# Patient Record
Sex: Female | Born: 1980 | Race: White | Hispanic: No | Marital: Married | Smoking: Never smoker
Health system: Southern US, Community
[De-identification: ages and names within clinical notes are randomized; demographics above are authoritative.]

## PROBLEM LIST (undated history)

## (undated) ENCOUNTER — Inpatient Hospital Stay (HOSPITAL_COMMUNITY): Payer: Self-pay

## (undated) DIAGNOSIS — E7212 Methylenetetrahydrofolate reductase deficiency: Secondary | ICD-10-CM

## (undated) DIAGNOSIS — Z1589 Genetic susceptibility to other disease: Secondary | ICD-10-CM

## (undated) DIAGNOSIS — R51 Headache: Secondary | ICD-10-CM

## (undated) DIAGNOSIS — Z789 Other specified health status: Secondary | ICD-10-CM

## (undated) DIAGNOSIS — Z8619 Personal history of other infectious and parasitic diseases: Secondary | ICD-10-CM

## (undated) HISTORY — DX: Personal history of other infectious and parasitic diseases: Z86.19

## (undated) HISTORY — DX: Methylenetetrahydrofolate reductase deficiency: E72.12

## (undated) HISTORY — DX: Genetic susceptibility to other disease: Z15.89

## (undated) HISTORY — DX: Headache: R51

## (undated) HISTORY — PX: NO PAST SURGERIES: SHX2092

---

## 2008-08-14 ENCOUNTER — Ambulatory Visit: Payer: Self-pay | Admitting: Internal Medicine

## 2008-08-14 DIAGNOSIS — R519 Headache, unspecified: Secondary | ICD-10-CM | POA: Insufficient documentation

## 2008-08-14 DIAGNOSIS — R51 Headache: Secondary | ICD-10-CM

## 2008-08-14 LAB — CONVERTED CEMR LAB
Nitrite: NEGATIVE
Specific Gravity, Urine: 1.02
Urobilinogen, UA: NEGATIVE
pH: 5

## 2008-12-18 ENCOUNTER — Emergency Department (HOSPITAL_BASED_OUTPATIENT_CLINIC_OR_DEPARTMENT_OTHER): Admission: EM | Admit: 2008-12-18 | Discharge: 2008-12-18 | Payer: Self-pay | Admitting: Emergency Medicine

## 2009-04-30 ENCOUNTER — Inpatient Hospital Stay (HOSPITAL_COMMUNITY): Admission: AD | Admit: 2009-04-30 | Discharge: 2009-04-30 | Payer: Self-pay | Admitting: Internal Medicine

## 2009-06-02 ENCOUNTER — Inpatient Hospital Stay (HOSPITAL_COMMUNITY): Admission: RE | Admit: 2009-06-02 | Discharge: 2009-06-03 | Payer: Self-pay | Admitting: Obstetrics and Gynecology

## 2009-07-21 ENCOUNTER — Encounter: Admission: RE | Admit: 2009-07-21 | Discharge: 2009-07-21 | Payer: Self-pay | Admitting: Obstetrics and Gynecology

## 2010-01-26 ENCOUNTER — Encounter: Admission: RE | Admit: 2010-01-26 | Discharge: 2010-01-26 | Payer: Self-pay | Admitting: Obstetrics and Gynecology

## 2010-08-30 ENCOUNTER — Other Ambulatory Visit (HOSPITAL_COMMUNITY): Payer: Self-pay | Admitting: Obstetrics and Gynecology

## 2010-08-30 DIAGNOSIS — Z09 Encounter for follow-up examination after completed treatment for conditions other than malignant neoplasm: Secondary | ICD-10-CM

## 2010-09-13 ENCOUNTER — Ambulatory Visit
Admission: RE | Admit: 2010-09-13 | Discharge: 2010-09-13 | Disposition: A | Discharge status: Home / Self Care | Payer: PRIVATE HEALTH INSURANCE | Source: Ambulatory Visit | Attending: Obstetrics and Gynecology | Admitting: Obstetrics and Gynecology

## 2010-09-13 DIAGNOSIS — Z09 Encounter for follow-up examination after completed treatment for conditions other than malignant neoplasm: Secondary | ICD-10-CM

## 2010-09-13 LAB — CBC
HCT: 31 % — ABNORMAL LOW (ref 36.0–46.0)
HCT: 36.6 % (ref 36.0–46.0)
Hemoglobin: 10.7 g/dL — ABNORMAL LOW (ref 12.0–15.0)
Platelets: 150 10*3/uL (ref 150–400)
RBC: 3.36 MIL/uL — ABNORMAL LOW (ref 3.87–5.11)
RBC: 3.95 MIL/uL (ref 3.87–5.11)

## 2010-09-13 LAB — RPR: RPR Ser Ql: NONREACTIVE

## 2011-04-21 ENCOUNTER — Ambulatory Visit (INDEPENDENT_AMBULATORY_CARE_PROVIDER_SITE_OTHER): Payer: PRIVATE HEALTH INSURANCE | Admitting: Internal Medicine

## 2011-04-21 VITALS — BP 100/62 | Temp 98.4°F | Ht 67.0 in | Wt 133.0 lb

## 2011-04-21 DIAGNOSIS — Z23 Encounter for immunization: Secondary | ICD-10-CM

## 2011-04-21 DIAGNOSIS — Z0000 Encounter for general adult medical examination without abnormal findings: Secondary | ICD-10-CM

## 2011-04-21 DIAGNOSIS — H6120 Impacted cerumen, unspecified ear: Secondary | ICD-10-CM

## 2011-04-21 NOTE — Progress Notes (Signed)
  Subjective:    Patient ID: Becky Howard, female    DOB: 01-20-1981, 30 y.o.   MRN: [id number]  HPI 30 year old patient who is seen today complaining of diminished auditory acuity on the right. She has had some difficulties with cerumen impactions in the past. No ear pain fever or drainage  Review of Systems  HENT: Positive for hearing loss. Negative for ear pain.        Objective:   Physical Exam  Constitutional: She appears well-developed and well-nourished. No distress.  HENT:       Bilateral cerumen impactions right greater than left          Assessment & Plan:    Cerumen impaction. Both canals irrigated until clear

## 2011-04-21 NOTE — Patient Instructions (Signed)
Call or return to clinic prn if these symptoms worsen or fail to improve as anticipated.

## 2012-03-01 LAB — OB RESULTS CONSOLE ANTIBODY SCREEN: Antibody Screen: NEGATIVE

## 2012-03-01 LAB — OB RESULTS CONSOLE GC/CHLAMYDIA: Gonorrhea: NEGATIVE

## 2012-03-01 LAB — OB RESULTS CONSOLE ABO/RH: RH Type: POSITIVE

## 2012-03-01 LAB — OB RESULTS CONSOLE RUBELLA ANTIBODY, IGM: Rubella: IMMUNE

## 2012-03-01 LAB — OB RESULTS CONSOLE HEPATITIS B SURFACE ANTIGEN: Hepatitis B Surface Ag: NEGATIVE

## 2012-06-13 NOTE — L&D Delivery Note (Signed)
Delivery Note At 5:50 PM a viable and healthy female was delivered via Vaginal, Spontaneous Delivery (Presentation: ROA ).  APGAR: 8, 9; weight pending .   Placenta status: spontaneous, intact.  Cord:  with the following complications: Tight [place] x 2 , not reducible, cut on perineum .  Cord pH: na  Anesthesia: Epidural  Episiotomy: none Lacerations: second degree Suture Repair: 3.0 vicryl rapide Est. Blood Loss (mL): 300  Mom to postpartum.  Baby to nursery-stable.  Teigan Sahli J 10/01/2012, 6:04 PM

## 2012-08-30 ENCOUNTER — Inpatient Hospital Stay (HOSPITAL_COMMUNITY): Admission: AD | Admit: 2012-08-30 | Payer: Self-pay | Source: Ambulatory Visit | Admitting: Obstetrics and Gynecology

## 2012-09-03 LAB — OB RESULTS CONSOLE GBS: GBS: NEGATIVE

## 2012-09-28 ENCOUNTER — Encounter (HOSPITAL_COMMUNITY): Payer: Self-pay

## 2012-09-28 ENCOUNTER — Other Ambulatory Visit: Payer: Self-pay | Admitting: Obstetrics and Gynecology

## 2012-09-28 ENCOUNTER — Telehealth (HOSPITAL_COMMUNITY): Payer: Self-pay | Admitting: *Deleted

## 2012-09-28 ENCOUNTER — Observation Stay (HOSPITAL_COMMUNITY)
Admission: RE | Admit: 2012-09-28 | Discharge: 2012-09-28 | Disposition: A | Discharge status: Home / Self Care | Payer: PRIVATE HEALTH INSURANCE | Source: Ambulatory Visit | Attending: Obstetrics and Gynecology | Admitting: Obstetrics and Gynecology

## 2012-09-28 ENCOUNTER — Encounter (HOSPITAL_COMMUNITY): Payer: Self-pay | Admitting: *Deleted

## 2012-09-28 DIAGNOSIS — O321XX0 Maternal care for breech presentation, not applicable or unspecified: Secondary | ICD-10-CM | POA: Diagnosis present

## 2012-09-28 DIAGNOSIS — IMO0001 Reserved for inherently not codable concepts without codable children: Secondary | ICD-10-CM | POA: Diagnosis not present

## 2012-09-28 HISTORY — DX: Other specified health status: Z78.9

## 2012-09-28 MED ORDER — TERBUTALINE SULFATE 1 MG/ML IJ SOLN
0.2500 mg | Freq: Once | INTRAMUSCULAR | Status: AC
  Administered 2012-09-28: 0.25 mg via SUBCUTANEOUS
  Filled 2012-09-28: qty 1

## 2012-09-28 MED ORDER — LACTATED RINGERS IV SOLN
INTRAVENOUS | Status: DC
  Administered 2012-09-28: 08:00:00 via INTRAVENOUS

## 2012-09-28 NOTE — Telephone Encounter (Signed)
Preadmission screen  

## 2012-09-28 NOTE — Progress Notes (Signed)
Becky Howard is a 32 y.o. [id number] at [redacted]w[redacted]d by LMP admitted for ECV  Subjective: Comfortable  Objective: BP 125/71  Pulse 67  Temp(Src) 98.2 F (36.8 C) (Oral)  Ht 5\' 6"  (1.676 m)  Wt 77.111 kg (170 lb)  BMI 27.45 kg/m2  SpO2 100%      FHT:  FHR: 125 bpm, variability: moderate,  accelerations:  Present,  decelerations:  Absent UC:   None - UI noted SVE:    closed /50/out of pelvis  Labs: None ordered  Assessment / Plan: 38 weeks Frank breech for ECV- consent signed.  Labor: na Preeclampsia:  na Fetal Wellbeing:  Category I Pain Control:  Labor support without medications I/D:  n/a Anticipated MOD:  pending ECV  Rikki Smestad J 09/28/2012, 8:55 AM ECV note dictated

## 2012-09-28 NOTE — Progress Notes (Signed)
09/28/2012 @ 0820  S:Here for ECV - breech presentation     understands procedure - plan of care     active FM / occasional ctx / no bleeding   O:  VS: Blood pressure 125/71, pulse 66, temperature 98.2 F (36.8 C), temperature source Oral, height 5\' 6"  (1.676 m), weight 77.111 kg (170 lb).        FHR : baseline 125 / variability moderate / accels + / decels none        Toco: occasional - terbutaline 0.25mg  sub-q pre-procedure protocol        Cervix : deferred        Bedside sono : frank breech with head RUQ - subjectively normal AFI  A: breech presentation for ECV attempt     FHR category 1  P: Dr Billy [place] updated with sono results       prepare forECV attempt      consent signed     Marlinda Mike CNM, MSN 09/28/2012, [phone]

## 2012-09-29 NOTE — H&P (Signed)
NAMEMarland Kitchen  Becky Howard, Becky Howard NO.:  [id number]  MEDICAL RECORD NO.:  [id number]  LOCATION:  9172                          F[facility]ILITY:  [facility]  PHYSICIAN:  Lenoard Aden, M.D.DATE OF BIRTH:  30-Jan-1981  DATE OF ADMISSION:  09/28/2012 DATE OF DISCHARGE:  09/28/2012                             HISTORY & PHYSICAL   INDICATION:  [name] breech for external cephalic version.  She is a 32- year-old white female, G3, P2, at 8 and 4/7th weeks with frank breech presenting fetus for external cephalic version.  She has no known drug allergies.  MEDICATIONS:  Prenatal vitamins.  SOCIAL HISTORY:  She is a nonsmoker, nondrinker.  She denies domestic or physical violence.  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  FAMILY HISTORY:  Lymphoma, hypertension, thyroid cancer, and polycystic kidney disease.  OBSTETRIC HISTORY:  She has an obstetric history remarkable for 3 spontaneous losses and 2 in term deliveries.  Prenatal course otherwise uncomplicated.  PHYSICAL EXAMINATION:  GENERAL:  She is a well-developed, well- nourished, white female, in no acute distress. HEENT:  Normal. NECK:  Supple.  Full range of motion. LUNGS:  Clear. HEART:  Regular rhythm. ABDOMEN:  Soft gravid, nontender.  Estimated fetal weight 8.5 pounds. Cervix is closed, 50% breech and out of the pelvis. EXTREMITIES:  No cords. NEUROLOGIC:  Nonfocal. SKIN:  Intact.  IMPRESSION: 1. Term intrauterine pregnancy with frank breech presentation. 2. Good candidate for external cephalic version.  PLAN:  Proceed with external cephalic version.  Consent signed.  Small risk of fetal bradycardia with need for emergent C-section at approximately 5% rate is noted.  Incidence of small instance of placental abruption with fetal manipulation was discussed.  Failure rate of approximately 50% noted.  The need for post ECP monitoring noted, terbutaline given.  The patient acknowledges  and wishes to proceed.     Lenoard Aden, M.D.     RJT/MEDQ  D:  09/28/2012  T:  09/28/2012  Job:  [id number]

## 2012-09-29 NOTE — Op Note (Signed)
NAME:  Becky Howard, Becky Howard NO.:  [id number]  MEDICAL RECORD NO.:  [id number]  LOCATION:  9172                          [facility]ILITY[facility]  PHYSICIAN:  Lenoard Aden, M.D.DATE OF BIRTH:  1981-01-13  DATE OF PROCEDURE: DATE OF DISCHARGE:  09/28/2012                              OPERATIVE REPORT   DESCRIPTION OF PROCEDURE:  After being apprised of small risks of need for emergent C-section due to fetal bradycardia with need for emergent delivery, the patient's consents were signed after reactive NST. Ultrasound confirms a frank breech presentation supine to the maternal left.  Under ultrasound guidance, a forward roll was attempted after elevating the fetal buttocks out of the pelvis without success at this time then heart rate was assured to be normal and a backward roll was attempted in a standard fashion.  Upon manipulation, the version is successful.  Fetal heart tones postprocedure in the 50-60 beat per minute range x1-2 minute with slow elevation into the 120-130 beat per minute range, and now noted to be reactive without evidence of uterine contractions.  The patient tolerated the procedure well, is recovering. We will proceed with post ECV monitoring.  Discharge home once NST is reactive.  Standard labor precautions.  Follow up in the office 1 week.     Lenoard Aden, M.D.     RJT/MEDQ  D:  09/28/2012  T:  09/29/2012  Job:  [id number]

## 2012-10-01 ENCOUNTER — Inpatient Hospital Stay (HOSPITAL_COMMUNITY)
Admission: RE | Admit: 2012-10-01 | Discharge: 2012-10-02 | DRG: 775 | Disposition: A | Discharge status: Home / Self Care | Payer: PRIVATE HEALTH INSURANCE | Source: Ambulatory Visit | Attending: Obstetrics and Gynecology | Admitting: Obstetrics and Gynecology

## 2012-10-01 ENCOUNTER — Inpatient Hospital Stay (HOSPITAL_COMMUNITY): Payer: PRIVATE HEALTH INSURANCE | Admitting: Anesthesiology

## 2012-10-01 ENCOUNTER — Encounter (HOSPITAL_COMMUNITY): Payer: Self-pay

## 2012-10-01 ENCOUNTER — Other Ambulatory Visit: Payer: Self-pay | Admitting: Obstetrics and Gynecology

## 2012-10-01 ENCOUNTER — Encounter (HOSPITAL_COMMUNITY): Payer: Self-pay | Admitting: Anesthesiology

## 2012-10-01 VITALS — BP 115/68 | HR 55 | Temp 98.7°F | Resp 18 | Ht 66.0 in | Wt 170.0 lb

## 2012-10-01 DIAGNOSIS — IMO0001 Reserved for inherently not codable concepts without codable children: Secondary | ICD-10-CM

## 2012-10-01 DIAGNOSIS — R51 Headache: Secondary | ICD-10-CM

## 2012-10-01 DIAGNOSIS — IMO0002 Reserved for concepts with insufficient information to code with codable children: Principal | ICD-10-CM | POA: Diagnosis present

## 2012-10-01 DIAGNOSIS — O321XX0 Maternal care for breech presentation, not applicable or unspecified: Secondary | ICD-10-CM

## 2012-10-01 LAB — CBC
Platelets: 146 10*3/uL — ABNORMAL LOW (ref 150–400)
RBC: 3.86 MIL/uL — ABNORMAL LOW (ref 3.87–5.11)
WBC: 10.5 10*3/uL (ref 4.0–10.5)

## 2012-10-01 LAB — RPR: RPR Ser Ql: NONREACTIVE

## 2012-10-01 LAB — ABO/RH: ABO/RH(D): O POS

## 2012-10-01 MED ORDER — ONDANSETRON HCL 4 MG/2ML IJ SOLN: 4.0000 mg | INTRAMUSCULAR | Status: DC | PRN

## 2012-10-01 MED ORDER — OXYTOCIN BOLUS FROM INFUSION: 500.0000 mL | INTRAVENOUS | Status: DC

## 2012-10-01 MED ORDER — ZOLPIDEM TARTRATE 5 MG PO TABS: 5.0000 mg | ORAL_TABLET | Freq: Every evening | ORAL | Status: DC | PRN

## 2012-10-01 MED ORDER — DIPHENHYDRAMINE HCL 50 MG/ML IJ SOLN: 12.5000 mg | INTRAMUSCULAR | Status: DC | PRN

## 2012-10-01 MED ORDER — TERBUTALINE SULFATE 1 MG/ML IJ SOLN: 0.2500 mg | Freq: Once | INTRAMUSCULAR | Status: DC | PRN

## 2012-10-01 MED ORDER — FLEET ENEMA 7-19 GM/118ML RE ENEM: 1.0000 | ENEMA | RECTAL | Status: DC | PRN

## 2012-10-01 MED ORDER — EPHEDRINE 5 MG/ML INJ
10.0000 mg | INTRAVENOUS | Status: DC | PRN
  Administered 2012-10-01: 10 mg via INTRAVENOUS

## 2012-10-01 MED ORDER — TETANUS-DIPHTH-ACELL PERTUSSIS 5-2.5-18.5 LF-MCG/0.5 IM SUSP
0.5000 mL | Freq: Once | INTRAMUSCULAR | Status: AC
  Administered 2012-10-02: 0.5 mL via INTRAMUSCULAR
  Filled 2012-10-01 (×2): qty 0.5

## 2012-10-01 MED ORDER — PHENYLEPHRINE 40 MCG/ML (10ML) SYRINGE FOR IV PUSH (FOR BLOOD PRESSURE SUPPORT)
80.0000 ug | PREFILLED_SYRINGE | INTRAVENOUS | Status: DC | PRN
  Filled 2012-10-01: qty 5

## 2012-10-01 MED ORDER — METHYLERGONOVINE MALEATE 0.2 MG PO TABS: 0.2000 mg | ORAL_TABLET | ORAL | Status: DC | PRN

## 2012-10-01 MED ORDER — LANOLIN HYDROUS EX OINT: TOPICAL_OINTMENT | CUTANEOUS | Status: DC | PRN

## 2012-10-01 MED ORDER — BUTORPHANOL TARTRATE 1 MG/ML IJ SOLN: 1.0000 mg | INTRAMUSCULAR | Status: DC | PRN

## 2012-10-01 MED ORDER — WITCH HAZEL-GLYCERIN EX PADS: 1.0000 "application " | MEDICATED_PAD | CUTANEOUS | Status: DC | PRN

## 2012-10-01 MED ORDER — CITRIC ACID-SODIUM CITRATE 334-500 MG/5ML PO SOLN: 30.0000 mL | ORAL | Status: DC | PRN

## 2012-10-01 MED ORDER — FENTANYL 2.5 MCG/ML BUPIVACAINE 1/10 % EPIDURAL INFUSION (WH - ANES)
14.0000 mL/h | INTRAMUSCULAR | Status: DC | PRN
  Administered 2012-10-01: 14 mL/h via EPIDURAL
  Filled 2012-10-01: qty 125

## 2012-10-01 MED ORDER — LACTATED RINGERS IV SOLN
INTRAVENOUS | Status: DC
  Administered 2012-10-01 (×2): via INTRAVENOUS

## 2012-10-01 MED ORDER — IBUPROFEN 600 MG PO TABS: 600.0000 mg | ORAL_TABLET | Freq: Four times a day (QID) | ORAL | Status: DC | PRN

## 2012-10-01 MED ORDER — ONDANSETRON HCL 4 MG PO TABS: 4.0000 mg | ORAL_TABLET | ORAL | Status: DC | PRN

## 2012-10-01 MED ORDER — METHYLERGONOVINE MALEATE 0.2 MG/ML IJ SOLN: 0.2000 mg | INTRAMUSCULAR | Status: DC | PRN

## 2012-10-01 MED ORDER — SODIUM BICARBONATE 8.4 % IV SOLN
INTRAVENOUS | Status: DC | PRN
  Administered 2012-10-01: 5 mL via EPIDURAL

## 2012-10-01 MED ORDER — DIPHENHYDRAMINE HCL 25 MG PO CAPS: 25.0000 mg | ORAL_CAPSULE | Freq: Four times a day (QID) | ORAL | Status: DC | PRN

## 2012-10-01 MED ORDER — LIDOCAINE HCL (PF) 1 % IJ SOLN: 30.0000 mL | INTRAMUSCULAR | Status: DC | PRN

## 2012-10-01 MED ORDER — SIMETHICONE 80 MG PO CHEW: 80.0000 mg | CHEWABLE_TABLET | ORAL | Status: DC | PRN

## 2012-10-01 MED ORDER — OXYTOCIN 40 UNITS IN LACTATED RINGERS INFUSION - SIMPLE MED: 62.5000 mL/h | INTRAVENOUS | Status: DC

## 2012-10-01 MED ORDER — OXYTOCIN 40 UNITS IN LACTATED RINGERS INFUSION - SIMPLE MED
1.0000 m[IU]/min | INTRAVENOUS | Status: DC
  Administered 2012-10-01: 2 m[IU]/min via INTRAVENOUS
  Filled 2012-10-01: qty 1000

## 2012-10-01 MED ORDER — OXYCODONE-ACETAMINOPHEN 5-325 MG PO TABS: 1.0000 | ORAL_TABLET | ORAL | Status: DC | PRN

## 2012-10-01 MED ORDER — LACTATED RINGERS IV SOLN: 500.0000 mL | Freq: Once | INTRAVENOUS | Status: DC

## 2012-10-01 MED ORDER — SENNOSIDES-DOCUSATE SODIUM 8.6-50 MG PO TABS
2.0000 | ORAL_TABLET | Freq: Every day | ORAL | Status: DC
  Administered 2012-10-01: 2 via ORAL

## 2012-10-01 MED ORDER — ONDANSETRON HCL 4 MG/2ML IJ SOLN: 4.0000 mg | Freq: Four times a day (QID) | INTRAMUSCULAR | Status: DC | PRN

## 2012-10-01 MED ORDER — BENZOCAINE-MENTHOL 20-0.5 % EX AERO
1.0000 "application " | INHALATION_SPRAY | CUTANEOUS | Status: DC | PRN
  Filled 2012-10-01: qty 56

## 2012-10-01 MED ORDER — EPHEDRINE 5 MG/ML INJ
10.0000 mg | INTRAVENOUS | Status: DC | PRN
  Filled 2012-10-01: qty 4

## 2012-10-01 MED ORDER — LACTATED RINGERS IV SOLN: 500.0000 mL | INTRAVENOUS | Status: DC | PRN

## 2012-10-01 MED ORDER — PHENYLEPHRINE 40 MCG/ML (10ML) SYRINGE FOR IV PUSH (FOR BLOOD PRESSURE SUPPORT): 80.0000 ug | PREFILLED_SYRINGE | INTRAVENOUS | Status: DC | PRN

## 2012-10-01 MED ORDER — ACETAMINOPHEN 325 MG PO TABS: 650.0000 mg | ORAL_TABLET | ORAL | Status: DC | PRN

## 2012-10-01 MED ORDER — DIBUCAINE 1 % RE OINT
1.0000 "application " | TOPICAL_OINTMENT | RECTAL | Status: DC | PRN
  Filled 2012-10-01: qty 28

## 2012-10-01 MED ORDER — PRENATAL MULTIVITAMIN CH
1.0000 | ORAL_TABLET | Freq: Every day | ORAL | Status: DC
  Administered 2012-10-02: 1 via ORAL
  Filled 2012-10-01: qty 1

## 2012-10-01 MED ORDER — IBUPROFEN 600 MG PO TABS
600.0000 mg | ORAL_TABLET | Freq: Four times a day (QID) | ORAL | Status: DC
  Administered 2012-10-02 (×3): 600 mg via ORAL
  Filled 2012-10-01 (×3): qty 1

## 2012-10-01 NOTE — Anesthesia Procedure Notes (Signed)
Epidural Patient location during procedure: OB  Preanesthetic Checklist Completed: patient identified, site marked, surgical consent, pre-op evaluation, timeout performed, IV checked, risks and benefits discussed and monitors and equipment checked  Epidural Patient position: sitting Prep: site prepped and draped and DuraPrep Patient monitoring: continuous pulse ox and blood pressure Approach: midline Injection technique: LOR air  Needle:  Needle type: Tuohy  Needle gauge: 17 G Needle length: 9 cm and 9 Needle insertion depth: 4 cm Catheter type: closed end flexible Catheter size: 19 Gauge Catheter at skin depth: 9 cm Test dose: negative  Assessment Events: blood not aspirated, injection not painful, no injection resistance, negative IV test and no paresthesia  Additional Notes Dosing of Epidural:  1st dose, through catheter ............................................. epi 1:200K + Xylocaine 40 mg  2nd dose, through catheter, after waiting 3 minutes.....epi 1:200K + Xylocaine 60 mg    ( 2% Xylo charted as a single dose in Epic Meds for ease of charting; actual dosing was fractionated as above, for saftey's sake)  As each dose occurred, patient was free of IV sx; and patient exhibited no evidence of SA injection.  Patient is more comfortable after epidural dosed. Please see RN's note for documentation of vital signs,and FHR which are stable.  Patient reminded not to try to ambulate with numb legs, and that an RN must be present when she attempts to get up.       

## 2012-10-01 NOTE — H&P (Signed)
NAMEMarland Howard  RYELEIGH, SANTORE NO.:  [id number]  MEDICAL RECORD NO.:  [id number]  LOCATION:  9108                          [facility]ILITY:  [facility]  PHYSICIAN:  Lenoard Aden, M.D.DATE OF BIRTH:  [birth year]  DATE OF ADMISSION:  10/01/2012 DATE OF DISCHARGE:                             HISTORY & PHYSICAL   CHIEF COMPLAINT:  History of breech presentation, status post successful external cephalic version with favorable cervix for induction.  HISTORY OF PRESENT ILLNESS:  She is a 32 year old white female, G3, P2 at [redacted] weeks gestation with aforementioned indications for induction.  ALLERGIES:  She has no known drug allergies.  MEDICATIONS:  Prenatal vitamins.  SOCIAL HISTORY:  She is a nonsmoker, nondrinker.  She denies domestic or physical violence.  She has a history of vaginal delivery x2 and an SAB x2.  FAMILY HISTORY:  Lymphoma, hypertension, and thyroid cancer.  PHYSICAL EXAMINATION:  GENERAL:  She is a well-developed, well- nourished, white female, in no acute distress. HEENT:  Normal. NECK:  Supple.  Full range of motion. LUNGS:  Clear. HEART:  Regular rhythm. ABDOMEN:  Soft, gravid, nontender.  Estimated fetal weight of 8 pounds. Cervix is 2-3 cm, 60% vertex, -2. EXTREMITIES:  No cords. NEUROLOGIC:  Nonfocal. SKIN:  Intact.  IMPRESSION:  Term intrauterine pregnancy with a history of breech presentation, status post external cephalic version.  Unfavorable cervix.  Now for induction at 39 weeks.  PLAN:  Proceed with Pitocin induction epidural as needed.  Anticipate attempts at vaginal delivery.     Lenoard Aden, M.D.     RJT/MEDQ  D:  10/01/2012  T:  10/01/2012  Job:  [id number]

## 2012-10-01 NOTE — Progress Notes (Signed)
Becky Howard is a 32 y.o. [id number] at [redacted]w[redacted]d by LMP admitted for induction of labor due to history of breech s/p successful ECV.  Subjective: comfortable  Objective: BP 130/78  Pulse 78  Temp(Src) 98.3 F (36.8 C) (Oral)  Ht 5\' 6"  (1.676 m)  Wt 77.111 kg (170 lb)  BMI 27.45 kg/m2      FHT:  FHR: 155 bpm, variability: moderate,  accelerations:  Present,  decelerations:  Absent UC:   Every 3 min mild SVE:    3/60/-2 AROM-clear  Labs: CBC    Component Value Date/Time   WBC 10.5 10/01/2012 0820   RBC 3.86* 10/01/2012 0820   HGB 12.3 10/01/2012 0820   HCT 35.2* 10/01/2012 0820   PLT 146* 10/01/2012 0820   MCV 91.2 10/01/2012 0820   MCH 31.9 10/01/2012 0820   MCHC 34.9 10/01/2012 0820   RDW 13.7 10/01/2012 0820      Assessment / Plan: Induction of labor due to history of Breech s/p ECV,  progressing well on pitocin  Labor: Progressing normally Preeclampsia:  na Fetal Wellbeing:  Category I Pain Control:  Labor support without medications I/D:  n/a Anticipated MOD:  NSVD  [name] J 10/01/2012, 8:20 AM

## 2012-10-01 NOTE — Anesthesia Preprocedure Evaluation (Signed)

## 2012-10-02 ENCOUNTER — Encounter (HOSPITAL_COMMUNITY): Payer: Self-pay

## 2012-10-02 LAB — CBC
HCT: 33.2 % — ABNORMAL LOW (ref 36.0–46.0)
Hemoglobin: 11.3 g/dL — ABNORMAL LOW (ref 12.0–15.0)
RDW: 13.5 % (ref 11.5–15.5)
WBC: 13.2 10*3/uL — ABNORMAL HIGH (ref 4.0–10.5)

## 2012-10-02 MED ORDER — IBUPROFEN 600 MG PO TABS: 600.0000 mg | ORAL_TABLET | Freq: Four times a day (QID) | ORAL | Status: AC

## 2012-10-02 NOTE — Anesthesia Postprocedure Evaluation (Signed)
Anesthesia Post Note  Patient: Becky Howard  Procedure(s) Performed: * No procedures listed *  Anesthesia type: Epidural  Patient location: Mother/Baby  Post pain: Pain level controlled  Post assessment: Post-op Vital signs reviewed  Last Vitals:  Filed Vitals:   10/02/12 0600  BP: 115/68  Pulse: 55  Temp: 37.1 C  Resp: 18    Post vital signs: Reviewed  Level of consciousness: awake  Complications: No apparent anesthesia complications

## 2012-10-02 NOTE — Progress Notes (Signed)
Patient ID: Becky Howard, female   DOB: 1980-09-08, 32 y.o.   MRN: [id number] PPD # 1  Subjective: Pt reports feeling well and eager for early d/c home/ Pain controlled with ibuprofen Tolerating po/ Voiding without problems/ No n/v Bleeding is moderate Newborn info:  Information for the patient's newborn:  Elaijah, Munoz [[id number]]  female Feeding: breast   Objective:  VS: Blood pressure 115/68, pulse 55, temperature 98.7 F (37.1 C), temperature source Oral, resp. rate 18.    Recent Labs  10/01/12 0820 10/02/12 0615  WBC 10.5 13.2*  HGB 12.3 11.3*  HCT 35.2* 33.2*  PLT 146* 140*    Blood type: --/--/O POS (04/21 0820) Rubella: Immune (09/19 0000)    Physical Exam:  General:  alert, cooperative and no distress CV: Regular rate and rhythm Resp: clear Abdomen: soft, nontender, normal bowel sounds Uterine Fundus: firm, below umbilicus, nontender Perineum: healing with good reapproximation Lochia: moderate Ext: edema trace and Homans sign is negative, no sign of DVT   A/P: PPD # 1/ [id number]/ S/P: SVD w/2nd deg repair Doing well and stable for early d/c home later today.  Peds has cleared infant RX: Ibuprofen 600mg  po Q 6 hrs prn pain #30 Refill x 1 Rt pp visit in 6 wks     Demetrius Revel, MSN, [facility] 10/02/2012, 10:58 AM

## 2012-10-17 NOTE — Discharge Summary (Signed)
Obstetric Discharge Summary Reason for Admission: G6 P2 0 3 2 @ 39wks for IOL, s/p successful external cephalic version Prenatal Procedures: NST and ultrasound Intrapartum Procedures: spontaneous vaginal delivery Postpartum Procedures: none Complications-Operative and Postpartum: none Hemoglobin  Date Value Range Status  10/02/2012 11.3* 12.0 - 15.0 g/dL Final     HCT  Date Value Range Status  10/02/2012 33.2* 36.0 - 46.0 % Final    Physical Exam:  General: alert, cooperative and no distress Lochia: appropriate Uterine Fundus: firm Incision: na DVT Evaluation: No evidence of DVT seen on physical exam. Negative Homan's sign.  Discharge Diagnoses: Term Pregnancy-delivered  Discharge Information: Date: 10/17/2012 Activity: pelvic rest Diet: routine Medications: PNV, Ibuprofen, Colace and Iron Condition: stable Instructions: refer to practice specific booklet Discharge to: home   Newborn Data: Live born female  Birth Weight: 6 lb 14.4 oz (3130 g) APGAR: 8, 9  Home with mother.  Becky Howard K 10/17/2012, 10:34 AM

## 2012-12-24 ENCOUNTER — Ambulatory Visit (INDEPENDENT_AMBULATORY_CARE_PROVIDER_SITE_OTHER): Payer: PRIVATE HEALTH INSURANCE | Admitting: Internal Medicine

## 2012-12-24 ENCOUNTER — Encounter: Payer: Self-pay | Admitting: Internal Medicine

## 2012-12-24 VITALS — BP 120/70 | HR 60 | Temp 97.8°F | Resp 18 | Wt 147.0 lb

## 2012-12-24 DIAGNOSIS — H6120 Impacted cerumen, unspecified ear: Secondary | ICD-10-CM

## 2012-12-24 DIAGNOSIS — H6123 Impacted cerumen, bilateral: Secondary | ICD-10-CM

## 2012-12-24 NOTE — Patient Instructions (Signed)
Call or return to clinic prn if these symptoms worsen or fail to improve as anticipated.

## 2012-12-24 NOTE — Progress Notes (Signed)
  Subjective:    Patient ID: Becky Howard, female    DOB: September 30, 1980, 32 y.o.   MRN: [id number]  HPI   32 year old patient approximately 3 months SVD who presents with a decreased auditory acuity and a sensation of her ears clogged.  Past Medical History  Diagnosis Date  . Headache(784.0)   . Medical history non-contributory   . Hx of varicella   . MTHFR mutation   . Postpartum care following vaginal delivery (10/01/12) 10/02/2012  . SVD (spontaneous vaginal delivery) 10/02/2012    History   Social History  . Marital Status: Married    Spouse Name: N/A    Number of Children: N/A  . Years of Education: N/A   Occupational History  . Not on file.   Social History Main Topics  . Smoking status: Never Smoker   . Smokeless tobacco: Never Used  . Alcohol Use: No  . Drug Use: No  . Sexually Active: Not on file   Other Topics Concern  . Not on file   Social History Narrative  . No narrative on file    Past Surgical History  Procedure Laterality Date  . No past surgeries      Family History  Problem Relation Age of Onset  . Healthy Mother   . Polycystic kidney disease Father   . Hypertension Father   . Heart attack Brother   . Polycystic kidney disease Brother   . Heart attack Maternal Grandfather   . Heart attack Brother   . Heart attack Brother   . Cancer Maternal Grandmother     lymphoma, thyroid    No Known Allergies  Current Outpatient Prescriptions on File Prior to Visit  Medication Sig Dispense Refill  . ibuprofen (ADVIL,MOTRIN) 600 MG tablet Take 1 tablet (600 mg total) by mouth every 6 (six) hours.  30 tablet  1  . Prenatal Vit-Fe Fumarate-FA (PRENATAL MULTIVITAMIN) TABS Take 1 tablet by mouth daily at 12 noon.       No current facility-administered medications on file prior to visit.    BP 120/70  Pulse 60  Temp(Src) 97.8 F (36.6 C) (Oral)  Resp 18  Wt 147 lb (66.679 kg)  BMI 23.74 kg/m2  SpO2 98%  Breastfeeding? Yes       Review  of Systems  HENT: Positive for hearing loss.        Objective:   Physical Exam  HENT:  Bilateral cerumen impactions          Assessment & Plan:   Bilateral cerumen impactions. Both canals irrigated until clear

## 2013-07-19 ENCOUNTER — Ambulatory Visit (INDEPENDENT_AMBULATORY_CARE_PROVIDER_SITE_OTHER): Payer: PRIVATE HEALTH INSURANCE | Admitting: Internal Medicine

## 2013-07-19 ENCOUNTER — Encounter: Payer: Self-pay | Admitting: Internal Medicine

## 2013-07-19 VITALS — BP 140/90 | HR 64 | Temp 98.6°F | Resp 20 | Ht 66.0 in | Wt 135.0 lb

## 2013-07-19 DIAGNOSIS — F4320 Adjustment disorder, unspecified: Secondary | ICD-10-CM

## 2013-07-19 NOTE — Progress Notes (Signed)
Pre-visit discussion using our clinic review tool. No additional management support is needed unless otherwise documented below in the visit note.  

## 2013-07-19 NOTE — Patient Instructions (Signed)
It is important that you exercise regularly, at least 20 minutes 3 to 4 times per week.  If you develop chest pain or shortness of breath seek  medical attention.  Call or return to clinic prn if these symptoms worsen or fail to improve as anticipated.  

## 2013-07-19 NOTE — Progress Notes (Signed)
Subjective:    Patient ID: Becky Howard, female    DOB: June 28, 1980, 33 y.o.   MRN: [id number]  HPI  33 year old patient young mother of 3 his youngest is now approximately 5210 months of age. She continues to breast-feed. Complaints include insomnia anxiety and general sense of unwellness. She is also return to work part-time. 2 weeks ago had episode of significant vertigo associated with nausea vomiting but the symptoms have largely resolved. She still has some early morning nausea. Due to her young child and nursing demands does not feel that she sleeps very well. She has resumed work part-time and feels a bit overextended.  Past Medical History  Diagnosis Date  . Headache(784.0)   . Medical history non-contributory   . Hx of varicella   . MTHFR mutation   . Postpartum care following vaginal delivery (10/01/12) 10/02/2012  . SVD (spontaneous vaginal delivery) 10/02/2012    History   Social History  . Marital Status: Married    Spouse Name: N/A    Number of Children: N/A  . Years of Education: N/A   Occupational History  . Not on file.   Social History Main Topics  . Smoking status: Never Smoker   . Smokeless tobacco: Never Used  . Alcohol Use: No  . Drug Use: No  . Sexual Activity: Not on file   Other Topics Concern  . Not on file   Social History Narrative  . No narrative on file    Past Surgical History  Procedure Laterality Date  . No past surgeries      Family History  Problem Relation Age of Onset  . Healthy Mother   . Polycystic kidney disease Father   . Hypertension Father   . Heart attack Brother   . Polycystic kidney disease Brother   . Heart attack Maternal Grandfather   . Heart attack Brother   . Heart attack Brother   . Cancer Maternal Grandmother     lymphoma, thyroid    No Known Allergies  Current Outpatient Prescriptions on File Prior to Visit  Medication Sig Dispense Refill  . ibuprofen (ADVIL,MOTRIN) 600 MG tablet Take 1 tablet (600  mg total) by mouth every 6 (six) hours.  30 tablet  1  . Prenatal Vit-Fe Fumarate-FA (PRENATAL MULTIVITAMIN) TABS Take 1 tablet by mouth daily at 12 noon.       No current facility-administered medications on file prior to visit.    BP 140/90  Pulse 64  Temp(Src) 98.6 F (37 C) (Oral)  Resp 20  Ht 5\' 6"  (1.676 m)  Wt 135 lb (61.236 kg)  BMI 21.80 kg/m2  SpO2 98%  Breastfeeding? Yes       Review of Systems  Constitutional: Positive for fatigue.  HENT: Negative for congestion, dental problem, hearing loss, rhinorrhea, sinus pressure, sore throat and tinnitus.   Eyes: Negative for pain, discharge and visual disturbance.  Respiratory: Negative for cough and shortness of breath.   Cardiovascular: Negative for chest pain, palpitations and leg swelling.  Gastrointestinal: Positive for nausea. Negative for vomiting, abdominal pain, diarrhea, constipation, blood in stool and abdominal distention.  Genitourinary: Negative for dysuria, urgency, frequency, hematuria, flank pain, vaginal bleeding, vaginal discharge, difficulty urinating, vaginal pain and pelvic pain.  Musculoskeletal: Negative for arthralgias, gait problem and joint swelling.  Skin: Negative for rash.  Neurological: Positive for light-headedness. Negative for dizziness, syncope, speech difficulty, weakness, numbness and headaches.  Hematological: Negative for adenopathy.  Psychiatric/Behavioral: Positive for sleep disturbance. Negative for  behavioral problems, dysphoric mood and agitation. The patient is nervous/anxious.        Objective:   Physical Exam  Constitutional: She is oriented to person, place, and time. She appears well-developed and well-nourished.  HENT:  Head: Normocephalic.  Right Ear: External ear normal.  Left Ear: External ear normal.  Mouth/Throat: Oropharynx is clear and moist.  Eyes: Conjunctivae and EOM are normal. Pupils are equal, round, and reactive to light.  Neck: Normal range of motion.  Neck supple. No thyromegaly present.  Cardiovascular: Normal rate, regular rhythm, normal heart sounds and intact distal pulses.   Pulmonary/Chest: Effort normal and breath sounds normal.  Abdominal: Soft. Bowel sounds are normal. She exhibits no mass. There is no tenderness.  Musculoskeletal: Normal range of motion.  Lymphadenopathy:    She has no cervical adenopathy.  Neurological: She is alert and oriented to person, place, and time.  Skin: Skin is warm and dry. No rash noted.  Psychiatric: She has a normal mood and affect. Her behavior is normal.          Assessment & Plan:   Situational stress/sleep deprivation.  Rest and relaxation techniques discussed. We'll attempt to minimize stress and perhaps cut back on her part-time hours Benign positional vertigo. Largely resolved. We'll continue to observe

## 2013-07-22 ENCOUNTER — Ambulatory Visit (INDEPENDENT_AMBULATORY_CARE_PROVIDER_SITE_OTHER): Payer: PRIVATE HEALTH INSURANCE | Admitting: Family Medicine

## 2013-07-22 ENCOUNTER — Encounter: Payer: Self-pay | Admitting: Family Medicine

## 2013-07-22 ENCOUNTER — Telehealth: Payer: Self-pay | Admitting: Internal Medicine

## 2013-07-22 VITALS — BP 120/90 | HR 60 | Temp 98.6°F | Wt 133.0 lb

## 2013-07-22 DIAGNOSIS — R5381 Other malaise: Secondary | ICD-10-CM

## 2013-07-22 DIAGNOSIS — R5383 Other fatigue: Principal | ICD-10-CM

## 2013-07-22 DIAGNOSIS — F4320 Adjustment disorder, unspecified: Secondary | ICD-10-CM

## 2013-07-22 NOTE — Telephone Encounter (Signed)
Patient called into triage line regarding dizziness. Attempted to call back at [phone]. No answer. Left voicemail to call back to office for assistance.

## 2013-07-22 NOTE — Progress Notes (Signed)
Chief Complaint  Patient presents with  . Dizziness    HPI:  Doc of day appt for  Vertigo: -seen by PCP a few days ago for this and adjustment disorder and dx with benign positional vertigo per review of notes after stomach bug per her report -symptoms have improved significant after getting some sleep this weekend but still having a feeling of not as much energy and in a fog (no more nausea or vomiting, normal bowels) -not actually dizzy or lightheaded - more just feels a little foggy and weak -does 6 hours of vigorous exercise per week or more - burns 600 calories per class and much more then she had been doing, eats very healthy, breastfeeding, working a lot - but has cut back for the rest of the month after talking with Dr. Kirtland Bouchard  ROS: See pertinent positives and negatives per HPI.  Past Medical History  Diagnosis Date  . Headache(784.0)   . Medical history non-contributory   . Hx of varicella   . MTHFR mutation   . Postpartum care following vaginal delivery (10/01/12) 10/02/2012  . SVD (spontaneous vaginal delivery) 10/02/2012    Past Surgical History  Procedure Laterality Date  . No past surgeries      Family History  Problem Relation Age of Onset  . Healthy Mother   . Polycystic kidney disease Father   . Hypertension Father   . Heart attack Brother   . Polycystic kidney disease Brother   . Heart attack Maternal Grandfather   . Heart attack Brother   . Heart attack Brother   . Cancer Maternal Grandmother     lymphoma, thyroid    History   Social History  . Marital Status: Married    Spouse Name: N/A    Number of Children: N/A  . Years of Education: N/A   Social History Main Topics  . Smoking status: Never Smoker   . Smokeless tobacco: Never Used  . Alcohol Use: No  . Drug Use: No  . Sexual Activity: None   Other Topics Concern  . None   Social History Narrative  . None    Current outpatient prescriptions:ibuprofen (ADVIL,MOTRIN) 600 MG tablet, Take 1  tablet (600 mg total) by mouth every 6 (six) hours., Disp: 30 tablet, Rfl: 1;  Prenatal Vit-Fe Fumarate-FA (PRENATAL MULTIVITAMIN) TABS, Take 1 tablet by mouth daily at 12 noon., Disp: , Rfl:   EXAM:  Filed Vitals:   07/22/13 1422  BP: 120/90  Pulse: 60  Temp: 98.6 F (37 C)    Body mass index is 21.48 kg/(m^2).  GENERAL: vitals reviewed and listed above, alert, oriented, appears well hydrated and in no acute distress  HEENT: atraumatic, conjunttiva clear, no obvious abnormalities on inspection of external nose and ears  NECK: no obvious masses on inspection  LUNGS: clear to auscultation bilaterally, no wheezes, rales or rhonchi, good air movement  CV: HRRR, no peripheral edema  MS: moves all extremities without noticeable abnormality  PSYCH: pleasant and cooperative, no obvious depression or anxiety  ASSESSMENT AND PLAN:  Discussed the following assessment and plan:  Other malaise and fatigue  Adjustment disorder  -no depression, doesn not sound like she is getting enough calories for breastfeeding and infensive exercise and is recovering from stomach bug -symptoms improving so advised continued attention to proper sleep, increasing calories, follow up with PCP in 1 month or sooner if needed -Patient advised to return or notify a doctor immediately if symptoms worsen or persist or new concerns  arise.  Patient Instructions  -plenty of sleep and calories  -follow up in 1 month     Becky Howard R.

## 2013-07-22 NOTE — Progress Notes (Signed)
Pre visit review using our clinic review tool, if applicable. No additional management support is needed unless otherwise documented below in the visit note. 

## 2013-07-22 NOTE — Patient Instructions (Signed)
-  plenty of sleep and calories  -follow up in 1 month

## 2013-07-22 NOTE — Telephone Encounter (Signed)
Patient Information:  Caller Name: Becky Howard  Phone: [phone]  Patient: Becky Howard, Becky Howard  Gender: Female  DOB: 17-Jan-1981  Age: 33 Years  PCP: Eleonore ChiquitoKwiatkowski, Peter (Family Practice > 6241yrs old)  Pregnant: No  Office Follow Up:  Does the office need to follow up with this patient?: No  Instructions For The Office: N/A   Symptoms  Reason For Call & Symptoms: Ptt saw Dr. Amador Cunaskwiatkowski 07/19/13 for vomiting and light headedness.  She rested this past weekend.  N&V has stopped but she is still feeling lightheaded.  Dr told her if SX did not resolve to call for possible recheck.  B/P 07/19/13 was 140/90.  Reviewed Health History In EMR: Yes  Reviewed Medications In EMR: Yes  Reviewed Allergies In EMR: Yes  Reviewed Surgeries / Procedures: Yes  Date of Onset of Symptoms: 07/16/2013  Treatments Tried: rest, hydration and eating  Treatments Tried Worked: No OB / GYN:  LMP: Unknown  Guideline(s) Used:  Weakness (Generalized) and Fatigue  Disposition Per Guideline:   Go to Office Now  Reason For Disposition Reached:   Moderate weakness (i.e., interferes with work, school, normal activities) and cause unknown  Advice Given:  N/A  Patient Will Follow Care Advice:  YES  Appointment Scheduled:  07/22/2013 14:15:00 Appointment Scheduled Provider:  Selena BattenKim (TEXT 1st, after 20 mins can call), Dahlia ClientHannah [facility](Family Practice)

## 2013-07-24 ENCOUNTER — Telehealth: Payer: Self-pay | Admitting: Internal Medicine

## 2013-07-24 NOTE — Telephone Encounter (Signed)
Dr. Kirtland BouchardK, pt would like to talk to you regarding nausea and vomiting. Please call pt.

## 2013-07-24 NOTE — Telephone Encounter (Signed)
Pt saw dr Amador Cunaskwiatkowski on 07-19-13. Pt was experiencing nausea and would like dr Amador Cunaskwiatkowski to return her call. Pt saw dr Selena Battenkim on 07-22-13

## 2013-07-25 ENCOUNTER — Other Ambulatory Visit (INDEPENDENT_AMBULATORY_CARE_PROVIDER_SITE_OTHER): Payer: PRIVATE HEALTH INSURANCE

## 2013-07-25 ENCOUNTER — Other Ambulatory Visit: Payer: Self-pay | Admitting: Internal Medicine

## 2013-07-25 DIAGNOSIS — R112 Nausea with vomiting, unspecified: Secondary | ICD-10-CM

## 2013-07-25 DIAGNOSIS — R5383 Other fatigue: Secondary | ICD-10-CM

## 2013-07-25 DIAGNOSIS — R5381 Other malaise: Secondary | ICD-10-CM

## 2013-07-25 LAB — CBC WITH DIFFERENTIAL/PLATELET
BASOS ABS: 0 10*3/uL (ref 0.0–0.1)
BASOS PCT: 0.5 % (ref 0.0–3.0)
EOS PCT: 0.4 % (ref 0.0–5.0)
Eosinophils Absolute: 0 10*3/uL (ref 0.0–0.7)
HCT: 41.5 % (ref 36.0–46.0)
HEMOGLOBIN: 13.8 g/dL (ref 12.0–15.0)
LYMPHS PCT: 20.7 % (ref 12.0–46.0)
Lymphs Abs: 1.1 10*3/uL (ref 0.7–4.0)
MCHC: 33.2 g/dL (ref 30.0–36.0)
MCV: 90.9 fl (ref 78.0–100.0)
MONOS PCT: 6.3 % (ref 3.0–12.0)
Monocytes Absolute: 0.3 10*3/uL (ref 0.1–1.0)
NEUTROS ABS: 3.7 10*3/uL (ref 1.4–7.7)
Neutrophils Relative %: 72.1 % (ref 43.0–77.0)
Platelets: 213 10*3/uL (ref 150.0–400.0)
RBC: 4.56 Mil/uL (ref 3.87–5.11)
RDW: 13.1 % (ref 11.5–14.6)
WBC: 5.2 10*3/uL (ref 4.5–10.5)

## 2013-07-25 LAB — BASIC METABOLIC PANEL
BUN: 14 mg/dL (ref 6–23)
CHLORIDE: 103 meq/L (ref 96–112)
CO2: 27 meq/L (ref 19–32)
CREATININE: 0.8 mg/dL (ref 0.4–1.2)
Calcium: 9.6 mg/dL (ref 8.4–10.5)
GFR: 89.56 mL/min (ref >60.00)
GLUCOSE: 101 mg/dL — AB (ref 70–99)
Potassium: 4.2 mEq/L (ref 3.5–5.1)
Sodium: 138 mEq/L (ref 135–145)

## 2013-07-25 LAB — HCG, QUANTITATIVE, PREGNANCY: hCG, Beta Chain, Quant, S: 0.2 m[IU]/mL

## 2013-07-26 ENCOUNTER — Encounter: Payer: Self-pay | Admitting: Internal Medicine

## 2013-07-26 NOTE — Addendum Note (Signed)
Addended by: Rita OharaHRASHER, Aijalon Kirtz R on: 07/26/2013 02:14 PM   Modules accepted: Orders

## 2013-07-26 NOTE — Addendum Note (Signed)
Addended by: Rita OharaHRASHER, Selena Swaminathan R on: 07/26/2013 02:05 PM   Modules accepted: Orders

## 2013-08-26 ENCOUNTER — Telehealth: Payer: Self-pay | Admitting: Internal Medicine

## 2013-08-26 NOTE — Telephone Encounter (Signed)
Pt has appt 3/25. First available, but would like to speak w/ you prior to then. Pt having anxiety issues and would like to discuss meds. Pt prefers Dr KKirtland Bouchard

## 2013-08-27 NOTE — Telephone Encounter (Signed)
Spoke to pt said she went to see Therapist and thinks medication needs to be changed that she is not having post-partum anxiety. Told her needs to be seen to discuss. Told her can see if earlier appointment. Pt said yes. Appointment scheduled for Monday March 23 at 4:00 pm with Dr. Kirtland BouchardK Pt verbalized understanding.

## 2013-09-02 ENCOUNTER — Encounter: Payer: Self-pay | Admitting: Internal Medicine

## 2013-09-02 ENCOUNTER — Ambulatory Visit (INDEPENDENT_AMBULATORY_CARE_PROVIDER_SITE_OTHER): Payer: PRIVATE HEALTH INSURANCE | Admitting: Internal Medicine

## 2013-09-02 VITALS — BP 110/64 | HR 58 | Temp 97.9°F | Resp 18 | Ht 66.0 in | Wt 134.0 lb

## 2013-09-02 DIAGNOSIS — F411 Generalized anxiety disorder: Secondary | ICD-10-CM

## 2013-09-02 MED ORDER — ESCITALOPRAM OXALATE 5 MG PO TABS: 5.0000 mg | ORAL_TABLET | Freq: Every day | ORAL | Status: DC

## 2013-09-02 MED ORDER — ALPRAZOLAM 0.25 MG PO TABS: 0.2500 mg | ORAL_TABLET | Freq: Two times a day (BID) | ORAL | Status: AC | PRN

## 2013-09-02 NOTE — Patient Instructions (Addendum)
Return in 6 weeks for followup  Taper and discontinue BuSpar

## 2013-09-02 NOTE — Progress Notes (Signed)
Subjective:    Patient ID: Becky Howard, female    DOB: Mar 11, 1981, 33 y.o.   MRN: [id number]  HPI 33 year old patient who is seen today in followup.  She has been followed by a therapist and has improved nicely.  She was felt to have some situational anxiety and possibly mild postpartum anxiety/depression , but now is felt to have more of a mild panic disorder.  She describes episodes of increasing  anxiety   and then episodes of extreme anxiety with nausea, vomiting, dizziness, and fatigue.  Symptoms often last 1-2 days.  She has been on these are with some improvement.  Her therapist has suggested a trial of SSRI and benzodiazepine when necessary.  Drug therapy discussed at length.  She continues to breast-feed, but her child is now 9512 months old, and expected to discontinue soon.   Past Medical History  Diagnosis Date  . Headache(784.0)   . Medical history non-contributory   . Hx of varicella   . MTHFR mutation   . Postpartum care following vaginal delivery (10/01/12) 10/02/2012  . SVD (spontaneous vaginal delivery) 10/02/2012    History   Social History  . Marital Status: Married    Spouse Name: N/A    Number of Children: N/A  . Years of Education: N/A   Occupational History  . Not on file.   Social History Main Topics  . Smoking status: Never Smoker   . Smokeless tobacco: Never Used  . Alcohol Use: No  . Drug Use: No  . Sexual Activity: Not on file   Other Topics Concern  . Not on file   Social History Narrative  . No narrative on file    Past Surgical History  Procedure Laterality Date  . No past surgeries      Family History  Problem Relation Age of Onset  . Healthy Mother   . Polycystic kidney disease Father   . Hypertension Father   . Heart attack Brother   . Polycystic kidney disease Brother   . Heart attack Maternal Grandfather   . Heart attack Brother   . Heart attack Brother   . Cancer Maternal Grandmother     lymphoma, thyroid    No Known  Allergies  Current Outpatient Prescriptions on File Prior to Visit  Medication Sig Dispense Refill  . ibuprofen (ADVIL,MOTRIN) 600 MG tablet Take 1 tablet (600 mg total) by mouth every 6 (six) hours.  30 tablet  1   No current facility-administered medications on file prior to visit.    BP 110/64  Pulse 58  Temp(Src) 97.9 F (36.6 C) (Oral)  Resp 18  Ht 5\' 6"  (1.676 m)  Wt 134 lb (60.782 kg)  BMI 21.64 kg/m2  SpO2 99%  Breastfeeding? Yes       Review of Systems  Constitutional: Negative.   HENT: Negative for congestion, dental problem, hearing loss, rhinorrhea, sinus pressure, sore throat and tinnitus.   Eyes: Negative for pain, discharge and visual disturbance.  Respiratory: Negative for cough and shortness of breath.   Cardiovascular: Negative for chest pain, palpitations and leg swelling.  Gastrointestinal: Negative for nausea, vomiting, abdominal pain, diarrhea, constipation, blood in stool and abdominal distention.  Genitourinary: Negative for dysuria, urgency, frequency, hematuria, flank pain, vaginal bleeding, vaginal discharge, difficulty urinating, vaginal pain and pelvic pain.  Musculoskeletal: Negative for arthralgias, gait problem and joint swelling.  Skin: Negative for rash.  Neurological: Negative for dizziness, syncope, speech difficulty, weakness, numbness and headaches.  Hematological: Negative for  adenopathy.  Psychiatric/Behavioral: Negative for behavioral problems, dysphoric mood and agitation. The patient is nervous/anxious.        Objective:   Physical Exam  Constitutional: She appears well-developed and well-nourished. No distress.  Psychiatric: She has a normal mood and affect. Her behavior is normal. Judgment and thought content normal.          Assessment & Plan:  Anxiety/panic disorder.  We'll start on Lexapro 5 and titrate if necessary.  Patient states she is quite sensitive to medication.  We'll taper and discontinue BuSpar.  Patient  also given a prescription for Xanax to take as needed.  We'll continue  followup with therapist, which has been quite helpful.  Recheck in 6 weeks.  The patient has been asked to e-mail me in 3-4 weeks for a progress report or any time if necessary

## 2013-09-02 NOTE — Progress Notes (Signed)
Pre-visit discussion using our clinic review tool. No additional management support is needed unless otherwise documented below in the visit note.  

## 2013-09-04 ENCOUNTER — Ambulatory Visit: Payer: PRIVATE HEALTH INSURANCE | Admitting: Internal Medicine

## 2013-09-04 ENCOUNTER — Encounter: Payer: Self-pay | Admitting: Internal Medicine

## 2013-09-16 ENCOUNTER — Other Ambulatory Visit: Payer: Self-pay | Admitting: Internal Medicine

## 2013-09-16 MED ORDER — BUSPIRONE HCL 10 MG PO TABS: 10.0000 mg | ORAL_TABLET | Freq: Two times a day (BID) | ORAL | Status: DC

## 2013-09-26 ENCOUNTER — Ambulatory Visit: Payer: PRIVATE HEALTH INSURANCE | Admitting: Internal Medicine

## 2013-10-08 ENCOUNTER — Encounter: Payer: Self-pay | Admitting: Internal Medicine

## 2013-10-09 ENCOUNTER — Encounter: Payer: Self-pay | Admitting: Internal Medicine

## 2013-10-09 ENCOUNTER — Ambulatory Visit (INDEPENDENT_AMBULATORY_CARE_PROVIDER_SITE_OTHER): Payer: PRIVATE HEALTH INSURANCE | Admitting: Internal Medicine

## 2013-10-09 VITALS — BP 110/70 | HR 81 | Temp 98.3°F | Resp 18 | Ht 66.0 in | Wt 131.0 lb

## 2013-10-09 DIAGNOSIS — F419 Anxiety disorder, unspecified: Secondary | ICD-10-CM

## 2013-10-09 DIAGNOSIS — F411 Generalized anxiety disorder: Secondary | ICD-10-CM

## 2013-10-09 MED ORDER — ESCITALOPRAM OXALATE 10 MG PO TABS: 10.0000 mg | ORAL_TABLET | Freq: Every day | ORAL | Status: DC

## 2013-10-09 NOTE — Patient Instructions (Signed)
Decrease BuSpar to 10 mg daily for 2 weeks and then discontinue  Return in 6 months for follow-up

## 2013-10-09 NOTE — Progress Notes (Signed)
Pre-visit discussion using our clinic review tool. No additional management support is needed unless otherwise documented below in the visit note.  

## 2013-12-14 ENCOUNTER — Other Ambulatory Visit: Payer: Self-pay | Admitting: Internal Medicine

## 2013-12-17 NOTE — Telephone Encounter (Signed)
Dr. Kirtland BouchardK, okay for pt to continue Buspar and to increase Lexapro to 20 mg?

## 2014-03-12 ENCOUNTER — Encounter: Payer: Self-pay | Admitting: Internal Medicine

## 2014-03-13 ENCOUNTER — Ambulatory Visit (INDEPENDENT_AMBULATORY_CARE_PROVIDER_SITE_OTHER): Payer: PRIVATE HEALTH INSURANCE

## 2014-03-13 DIAGNOSIS — Z23 Encounter for immunization: Secondary | ICD-10-CM

## 2014-04-14 ENCOUNTER — Encounter: Payer: Self-pay | Admitting: Internal Medicine

## 2014-05-15 ENCOUNTER — Ambulatory Visit (INDEPENDENT_AMBULATORY_CARE_PROVIDER_SITE_OTHER): Payer: PRIVATE HEALTH INSURANCE | Admitting: Family Medicine

## 2014-05-15 ENCOUNTER — Encounter: Payer: Self-pay | Admitting: Family Medicine

## 2014-05-15 VITALS — BP 100/72 | HR 54 | Temp 97.8°F | Ht 66.0 in | Wt 141.3 lb

## 2014-05-15 DIAGNOSIS — R21 Rash and other nonspecific skin eruption: Secondary | ICD-10-CM

## 2014-05-15 MED ORDER — TRIAMCINOLONE ACETONIDE 0.1 % EX CREA: 1.0000 "application " | TOPICAL_CREAM | Freq: Two times a day (BID) | CUTANEOUS | Status: AC

## 2014-05-15 NOTE — Progress Notes (Signed)
Pre visit review using our clinic review tool, if applicable. No additional management support is needed unless otherwise documented below in the visit note. 

## 2014-05-15 NOTE — Patient Instructions (Signed)
-  avoid water and dirt - wear cotton lined non-latex gloves for housework, garden work  -apply cerave cream twice daily  -use a humidifier at night during the winter - follow cleaning instructions carefully  -aquafor throughout the day to protect hands  -avoid jewelry and lotions other then those listed above  -steroid cream twice daily for 1-2 weeks for flares  -follow up with dermatologist if persists or recurrent

## 2014-05-15 NOTE — Progress Notes (Signed)
HPI:  Acute visit for:  Rash: - on ring finger R hand -intermittent for several months, clears completely between flares when does not wear rings and uses hc cream -recurred this week -has dermatologist but difficult to get appt -denies: pain, malaise, drainage -does endorse pruritis and hx eczema elsewhere  ROS: See pertinent positives and negatives per HPI.  Past Medical History  Diagnosis Date  . Headache(784.0)   . Medical history non-contributory   . Hx of varicella   . MTHFR mutation   . Postpartum care following vaginal delivery (10/01/12) 10/02/2012  . SVD (spontaneous vaginal delivery) 10/02/2012    Past Surgical History  Procedure Laterality Date  . No past surgeries      Family History  Problem Relation Age of Onset  . Healthy Mother   . Polycystic kidney disease Father   . Hypertension Father   . Heart attack Brother   . Polycystic kidney disease Brother   . Heart attack Maternal Grandfather   . Heart attack Brother   . Heart attack Brother   . Cancer Maternal Grandmother     lymphoma, thyroid    History   Social History  . Marital Status: Married    Spouse Name: N/A    Number of Children: N/A  . Years of Education: N/A   Social History Main Topics  . Smoking status: Never Smoker   . Smokeless tobacco: Never Used  . Alcohol Use: No  . Drug Use: No  . Sexual Activity: None   Other Topics Concern  . None   Social History Narrative    Current outpatient prescriptions: ALPRAZolam (XANAX) 0.25 MG tablet, Take 1 tablet (0.25 mg total) by mouth 2 (two) times daily as needed for anxiety., Disp: 30 tablet, Rfl: 0;  escitalopram (LEXAPRO) 10 MG tablet, Take 1 tablet (10 mg total) by mouth daily. (Patient taking differently: 10 mg. Take 0.5mg  daily), Disp: 90 tablet, Rfl: 6 ibuprofen (ADVIL,MOTRIN) 600 MG tablet, Take 1 tablet (600 mg total) by mouth every 6 (six) hours., Disp: 30 tablet, Rfl: 1;  triamcinolone cream (KENALOG) 0.1 %, Apply 1  application topically 2 (two) times daily., Disp: 30 g, Rfl: 0  EXAM:  Filed Vitals:   05/15/14 0925  BP: 100/72  Pulse: 54  Temp: 97.8 F (36.6 C)    Body mass index is 22.82 kg/(m^2).  GENERAL: vitals reviewed and listed above, alert, oriented, appears well hydrated and in no acute distress  HEENT: atraumatic, conjunttiva clear, no obvious abnormalities on inspection of external nose and ears  NECK: no obvious masses on inspection  SKIN: small area of dry, scaly skin on R ring finger  MS: moves all extremities without noticeable abnormality  PSYCH: pleasant and cooperative, no obvious depression or anxiety  ASSESSMENT AND PLAN:  Discussed the following assessment and plan:  Rash and nonspecific skin eruption - Plan: triamcinolone cream (KENALOG) 0.1 %  -suspect contact dermatitis -instructions per below  -Patient advised to return or notify a doctor immediately if symptoms worsen or persist or new concerns arise.  Patient Instructions  -avoid water and dirt - wear cotton lined non-latex gloves for housework, garden work  -apply cerave cream twice daily  -use a humidifier at night during the winter - follow cleaning instructions carefully  -aquafor throughout the day to protect hands  -avoid jewelry and lotions other then those listed above  -steroid cream twice daily for 1-2 weeks for flares  -follow up with dermatologist if persists or recurrent  Colin Benton R.

## 2014-06-27 ENCOUNTER — Encounter: Payer: Self-pay | Admitting: Internal Medicine

## 2014-06-27 ENCOUNTER — Ambulatory Visit (INDEPENDENT_AMBULATORY_CARE_PROVIDER_SITE_OTHER): Payer: PRIVATE HEALTH INSURANCE | Admitting: Internal Medicine

## 2014-06-27 VITALS — BP 120/70 | HR 73 | Temp 98.0°F | Resp 20 | Ht 66.0 in | Wt 143.0 lb

## 2014-06-27 DIAGNOSIS — F411 Generalized anxiety disorder: Secondary | ICD-10-CM

## 2014-06-27 MED ORDER — VENLAFAXINE HCL ER 37.5 MG PO CP24: 37.5000 mg | ORAL_CAPSULE | Freq: Every day | ORAL | Status: DC

## 2014-06-27 NOTE — Progress Notes (Signed)
Subjective:    Patient ID: Becky Howard, female    DOB: 1980/11/15, 34 y.o.   MRN: [id number]  HPI  Wt Readings from Last 3 Encounters:  06/27/14 143 lb (64.864 kg)  05/15/14 141 lb 4.8 oz (64.093 kg)  10/09/13 131 lb (59.80 kg)   34 year old patient who has a history of anxiety/panic disorder, who is seen today in follow-up.  In general, she has done quite well.  She has self tapered Lexapro from a dose of 20 mg down to 5 mg every other day.  She resume 5 mg daily approximately 6 weeks ago due to increase in anxiety level.  She has had no panic attacks.  No alprazolam use since the summer. She feels fairly well but on the higher dose had issues with weight gain and fatigue.  Presently she is doing fairly well but still having some mild anxiety issues.  She continues to be followed by a Veterinary surgeon.  More recently she has had some modest weight loss on her present dose. She asked about consideration of a switch to Zoloft  Past Medical History  Diagnosis Date  . Headache(784.0)   . Medical history non-contributory   . Hx of varicella   . MTHFR mutation   . Postpartum care following vaginal delivery (10/01/12) 10/02/2012  . SVD (spontaneous vaginal delivery) 10/02/2012    History   Social History  . Marital Status: Married    Spouse Name: N/A    Number of Children: N/A  . Years of Education: N/A   Occupational History  . Not on file.   Social History Main Topics  . Smoking status: Never Smoker   . Smokeless tobacco: Never Used  . Alcohol Use: No  . Drug Use: No  . Sexual Activity: Not on file   Other Topics Concern  . Not on file   Social History Narrative    Past Surgical History  Procedure Laterality Date  . No past surgeries      Family History  Problem Relation Age of Onset  . Healthy Mother   . Polycystic kidney disease Father   . Hypertension Father   . Heart attack Brother   . Polycystic kidney disease Brother   . Heart attack Maternal Grandfather    . Heart attack Brother   . Heart attack Brother   . Cancer Maternal Grandmother     lymphoma, thyroid    No Known Allergies  Current Outpatient Prescriptions on File Prior to Visit  Medication Sig Dispense Refill  . ibuprofen (ADVIL,MOTRIN) 600 MG tablet Take 1 tablet (600 mg total) by mouth every 6 (six) hours. 30 tablet 1  . triamcinolone cream (KENALOG) 0.1 % Apply 1 application topically 2 (two) times daily. 30 g 0  . ALPRAZolam (XANAX) 0.25 MG tablet Take 1 tablet (0.25 mg total) by mouth 2 (two) times daily as needed for anxiety. (Patient not taking: Reported on 06/27/2014) 30 tablet 0   No current facility-administered medications on file prior to visit.    BP 120/70 mmHg  Pulse 73  Temp(Src) 98 F (36.7 C) (Oral)  Resp 20  Ht  (1.676 m)  Wt 143 lb (64.864 kg)  BMI 23.09 kg/m2  SpO2 98%  LMP 06/19/2014  Breastfeeding? No      Review of Systems  Constitutional: Positive for fatigue and unexpected weight change.  HENT: Negative for congestion, dental problem, hearing loss, rhinorrhea, sinus pressure, sore throat and tinnitus.   Eyes: Negative for pain, discharge and  visual disturbance.  Respiratory: Negative for cough and shortness of breath.   Cardiovascular: Negative for chest pain, palpitations and leg swelling.  Gastrointestinal: Negative for nausea, vomiting, abdominal pain, diarrhea, constipation, blood in stool and abdominal distention.  Genitourinary: Negative for dysuria, urgency, frequency, hematuria, flank pain, vaginal bleeding, vaginal discharge, difficulty urinating, vaginal pain and pelvic pain.  Musculoskeletal: Negative for joint swelling, arthralgias and gait problem.  Skin: Negative for rash.  Neurological: Negative for dizziness, syncope, speech difficulty, weakness, numbness and headaches.  Hematological: Negative for adenopathy.  Psychiatric/Behavioral: Negative for behavioral problems, dysphoric mood and agitation. The patient is  nervous/anxious.        Objective:   Physical Exam  Constitutional: She appears well-developed and well-nourished. No distress.  Blood pressure 120/70  Psychiatric: She has a normal mood and affect. Her behavior is normal. Judgment and thought content normal.          Assessment & Plan:   Anxiety/panic disorder.  Fairly well controlled.  We'll continue follow-up with her counselor Options discussed.  See no benefit to switching to another SSRI.  Believe the serotonin effect has been quite helpful.  We'll switch to a low dose SNRI and follow closely clinically

## 2014-06-27 NOTE — Patient Instructions (Signed)
It is important that you exercise regularly, at least 20 minutes 3 to 4 times per week.  If you develop chest pain or shortness of breath seek  medical attention.  Continue follow-up with your counselor  Call or return to clinic prn if these symptoms worsen or fail to improve as anticipated.  Return in 3 months for follow-up

## 2014-06-27 NOTE — Progress Notes (Signed)
Pre visit review using our clinic review tool, if applicable. No additional management support is needed unless otherwise documented below in the visit note. 

## 2014-09-12 ENCOUNTER — Ambulatory Visit (INDEPENDENT_AMBULATORY_CARE_PROVIDER_SITE_OTHER): Payer: PRIVATE HEALTH INSURANCE | Admitting: Internal Medicine

## 2014-09-12 ENCOUNTER — Encounter: Payer: Self-pay | Admitting: Internal Medicine

## 2014-09-12 VITALS — BP 108/62 | HR 73 | Temp 98.2°F | Resp 18 | Ht 66.0 in | Wt 140.0 lb

## 2014-09-12 DIAGNOSIS — L409 Psoriasis, unspecified: Secondary | ICD-10-CM | POA: Diagnosis not present

## 2014-09-12 MED ORDER — CLOBETASOL PROPIONATE 0.05 % EX SHAM: MEDICATED_SHAMPOO | CUTANEOUS | Status: AC

## 2014-09-12 NOTE — Progress Notes (Signed)
Pre visit review using our clinic review tool, if applicable. No additional management support is needed unless otherwise documented below in the visit note. 

## 2014-09-12 NOTE — Progress Notes (Signed)
Subjective:    Patient ID: Becky Howard, female    DOB: 1981-05-13, 34 y.o.   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  She has maintained good symptom control with head and shoulders shampoo as well as T-Gel shampoo.  More recently has had in recent pruritus in the scalp and especially in the posterior reticular areas  Past Medical History  Diagnosis Date  . Headache(784.0)   . Medical history non-contributory   . Hx of varicella   . MTHFR mutation   . Postpartum care following vaginal delivery (10/01/12) 10/02/2012  . SVD (spontaneous vaginal delivery) 10/02/2012    History   Social History  . Marital Status: Married    Spouse Name: N/A  . Number of Children: N/A  . Years of Education: N/A   Occupational History  . Not on file.   Social History Main Topics  . Smoking status: Never Smoker   . Smokeless tobacco: Never Used  . Alcohol Use: No  . Drug Use: No  . Sexual Activity: Not on file   Other Topics Concern  . Not on file   Social History Narrative    Past Surgical History  Procedure Laterality Date  . No past surgeries      Family History  Problem Relation Age of Onset  . Healthy Mother   . Polycystic kidney disease Father   . Hypertension Father   . Heart attack Brother   . Polycystic kidney disease Brother   . Heart attack Maternal Grandfather   . Heart attack Brother   . Heart attack Brother   . Cancer Maternal Grandmother     lymphoma, thyroid    No Known Allergies  Current Outpatient Prescriptions on File Prior to Visit  Medication Sig Dispense Refill  . ALPRAZolam (XANAX) 0.25 MG tablet Take 1 tablet (0.25 mg total) by mouth 2 (two) times daily as needed for anxiety. 30 tablet 0  . ibuprofen (ADVIL,MOTRIN) 600 MG tablet Take 1 tablet (600 mg total) by mouth every 6 (six) hours. 30 tablet 1  . triamcinolone cream (KENALOG) 0.1 % Apply 1 application  topically 2 (two) times daily. 30 g 0   No current facility-administered medications on file prior to visit.    BP 108/62 mmHg  Pulse 73  Temp(Src) 98.2 F (36.8 C) (Oral)  Resp 18  Ht  (1.676 m)  Wt 140 lb (63.504 kg)  BMI 22.61 kg/m2  SpO2 99%     Review of Systems  Constitutional: Negative.   HENT: Negative for congestion, dental problem, hearing loss, rhinorrhea, sinus pressure, sore throat and tinnitus.   Eyes: Negative for pain, discharge and visual disturbance.  Respiratory: Negative for cough and shortness of breath.   Cardiovascular: Negative for chest pain, palpitations and leg swelling.  Gastrointestinal: Negative for nausea, vomiting, abdominal pain, diarrhea, constipation, blood in stool and abdominal distention.  Genitourinary: Negative for dysuria, urgency, frequency, hematuria, flank pain, vaginal bleeding, vaginal discharge, difficulty urinating, vaginal pain and pelvic pain.  Musculoskeletal: Negative for joint swelling, arthralgias and gait problem.  Skin: Positive for rash.  Neurological: Negative for dizziness, syncope, speech difficulty, weakness, numbness and headaches.  Hematological: Negative for adenopathy.  Psychiatric/Behavioral: Negative for behavioral problems, dysphoric mood and agitation. The patient is not nervous/anxious.        Objective:   Physical Exam  Constitutional: She appears well-developed and well-nourished. No distress.  Skin:  The scalp generally appeared unremarkable There was considerable dryness, flaking and scaling, mainly involving the posterior reticular areas bilaterally          Assessment & Plan:   Scalp psoriasis.  Will give a trial of clobetasol 0.05 percent shampoo daily The patient will also try salicylic acid  Dermatology referral.  If unimproved

## 2014-09-12 NOTE — Patient Instructions (Signed)
Salicylic Acid topical gel, cream, lotion, solution What is this medicine? SALICYCLIC ACID (SAL i SIL ik AS id) breaks down layers of thick skin. It is used to treat common and plantar warts, psoriasis, calluses, and corns. It is also used to treat or to prevent acne. This medicine may be used for other purposes; ask your health care provider or pharmacist if you have questions. COMMON BRAND NAME(S): Akurza, Clear Away Liquid, Compound W, Corn/Callus Remover, Dermarest Psoriasis Moisturizer, Dermarest Psoriasis Overnight Treatment, Dermarest Psoriasis Scalp Treatment, Dermarest Psoriasis Skin Treatment, DuoFilm Wart Remover, Gordofilm, Hydrisalic, Keralyt, Neutrogena Acne Wash, Occlusal-HP, RE SA, SalAC, Salactic Film, Salacyn, Salex, Salitop, Scalpicin 2 in 1 Anti-Dandruff, UltraSal-ER, VIRASAL, Wart-Off What should I tell my health care provider before I take this medicine? They need to know if you have any of these conditions: -child with chickenpox, the flu, or other viral infection -kidney disease -liver disease -an unusual or allergic reaction to salicylic acid, other medicines, foods, dyes, or preservatives -pregnant or trying to get pregnant -breast-feeding How should I use this medicine? This medicine is for external use only. Follow the directions on the label. Do not apply to raw or irritated skin. Avoid getting medicine in your eyes, lips, nose, mouth, or other sensitive areas. Use this medicine at regular intervals. Do not use more often than directed. Talk to your pediatrician regarding the use of this medicine in children. Special care may be needed. This medicine is not approved for use in children under 2 years old. Overdosage: If you think you have taken too much of this medicine contact a poison control center or emergency room at once. NOTE: This medicine is only for you. Do not share this medicine with others. What if I miss a dose? If you miss a dose, use it as soon as you  can. If it is almost time for your next dose, use only that dose. Do not use double or extra doses. What may interact with this medicine? -medicines that change urine pH like ammonium chloride, sodium bicarbonate, and others -medicines that treat or prevent blood clots like warfarin -methotrexate -pyrazinamide -some medicines for diabetes -some medicines for gout -steroid medicines like prednisone or cortisone This list may not describe all possible interactions. Give your health care provider a list of all the medicines, herbs, non-prescription drugs, or dietary supplements you use. Also tell them if you smoke, drink alcohol, or use illegal drugs. Some items may interact with your medicine. What should I watch for while using this medicine? Tell your doctor is your symptoms do not get better or if they get worse. This medicine can make you more sensitive to the sun. Keep out of the sun. If you cannot avoid being in the sun, wear protective clothing and use sunscreen. Do not use sun lamps or tanning beds/booths. Use of this medicine in children under 12 years or in patients with kidney or liver disease may increase the risk of serious side effects. These patients should not use this medicine over large areas of skin. If you notice symptoms such as nausea, vomiting, dizziness, loss of hearing, ringing in the ears, unusual weakness or tiredness, fast or labored breathing, diarrhea, or confusion, stop using this medicine and contact your doctor or health care professional. What side effects may I notice from receiving this medicine? Side effects that you should report to your doctor or health care professional as soon as possible: -allergic reactions like skin rash, itching or hives, swelling of the face,   lips, or tongue Side effects that usually do not require medical attention (report to your doctor or health care professional if they continue or are bothersome): -skin irritation This list may not  describe all possible side effects. Call your doctor for medical advice about side effects. You may report side effects to FDA at 1-800-FDA-1088. Where should I keep my medicine? Keep out of the reach of children. Store at room temperature between 15 and 30 degrees C (59 and 86 degrees F). Do not freeze. Throw away any unused medicine after the expiration date. NOTE: This sheet is a summary. It may not cover all possible information. If you have questions about this medicine, talk to your doctor, pharmacist, or health care provider.  2015, [employer] Standard. (2008-02-01 13:36:20)  

## 2014-11-06 ENCOUNTER — Ambulatory Visit (INDEPENDENT_AMBULATORY_CARE_PROVIDER_SITE_OTHER): Payer: PRIVATE HEALTH INSURANCE | Admitting: Internal Medicine

## 2014-11-06 ENCOUNTER — Encounter: Payer: Self-pay | Admitting: Internal Medicine

## 2014-11-06 VITALS — BP 118/74 | HR 58 | Temp 98.1°F | Resp 18 | Ht 66.0 in | Wt 135.0 lb

## 2014-11-06 DIAGNOSIS — H6123 Impacted cerumen, bilateral: Secondary | ICD-10-CM | POA: Diagnosis not present

## 2014-11-06 DIAGNOSIS — H918X3 Other specified hearing loss, bilateral: Secondary | ICD-10-CM

## 2014-11-06 NOTE — Patient Instructions (Signed)
Cerumen Impaction °A cerumen impaction is when the wax in your ear forms a plug. This plug usually causes reduced hearing. Sometimes it also causes an earache or dizziness. Removing a cerumen impaction can be difficult and painful. The wax sticks to the ear canal. The canal is sensitive and bleeds easily. If you try to remove a heavy wax buildup with a cotton tipped swab, you may push it in further. °Irrigation with water, suction, and small ear curettes may be used to clear out the wax. If the impaction is fixed to the skin in the ear canal, ear drops may be needed for a few days to loosen the wax. People who build up a lot of wax frequently can use ear wax removal products available in your local drugstore. °SEEK MEDICAL CARE IF:  °You develop an earache, increased hearing loss, or marked dizziness. °Document Released: 07/07/2004 Document Revised: 08/22/2011 Document Reviewed: 08/27/2009 °[employer]® Patient Information ©2015 [employer]. This information is not intended to replace advice given to you by your health care provider. Make sure you discuss any questions you have with your health care provider. ° °

## 2014-11-06 NOTE — Progress Notes (Signed)
Subjective:    Patient ID: Becky Howard, female    DOB: 11/18/1980, 34 y.o.   MRN: [id number]  HPI 34 year old patient who presents with a complaint of bilateral hearing loss due to cerumen.  She has had this issue in the past periodically.  Otherwise, doing quite well.  Past Medical History  Diagnosis Date  . Headache(784.0)   . Medical history non-contributory   . Hx of varicella   . MTHFR mutation   . Postpartum care following vaginal delivery (10/01/12) 10/02/2012  . SVD (spontaneous vaginal delivery) 10/02/2012    History   Social History  . Marital Status: Married    Spouse Name: N/A  . Number of Children: N/A  . Years of Education: N/A   Occupational History  . Not on file.   Social History Main Topics  . Smoking status: Never Smoker   . Smokeless tobacco: Never Used  . Alcohol Use: No  . Drug Use: No  . Sexual Activity: Not on file   Other Topics Concern  . Not on file   Social History Narrative    Past Surgical History  Procedure Laterality Date  . No past surgeries      Family History  Problem Relation Age of Onset  . Healthy Mother   . Polycystic kidney disease Father   . Hypertension Father   . Heart attack Brother   . Polycystic kidney disease Brother   . Heart attack Maternal Grandfather   . Heart attack Brother   . Heart attack Brother   . Cancer Maternal Grandmother     lymphoma, thyroid    No Known Allergies  Current Outpatient Prescriptions on File Prior to Visit  Medication Sig Dispense Refill  . ALPRAZolam (XANAX) 0.25 MG tablet Take 1 tablet (0.25 mg total) by mouth 2 (two) times daily as needed for anxiety. 30 tablet 0  . Clobetasol Propionate 0.05 % shampoo Apply topically to scalp once or twice daily 118 mL 3  . ibuprofen (ADVIL,MOTRIN) 600 MG tablet Take 1 tablet (600 mg total) by mouth every 6 (six) hours. 30 tablet 1  . triamcinolone cream (KENALOG) 0.1 % Apply 1 application topically 2 (two) times daily. 30 g 0   No  current facility-administered medications on file prior to visit.    BP 118/74 mmHg  Pulse 58  Temp(Src) 98.1 F (36.7 C) (Oral)  Resp 18  Ht  (1.676 m)  Wt 135 lb (61.236 kg)  BMI 21.80 kg/m2  SpO2 98%  LMP 10/13/2014      Review of Systems  Constitutional: Negative.   HENT: Positive for hearing loss. Negative for congestion, dental problem, rhinorrhea, sinus pressure, sore throat and tinnitus.   Eyes: Negative for pain, discharge and visual disturbance.  Respiratory: Negative for cough and shortness of breath.   Cardiovascular: Negative for chest pain, palpitations and leg swelling.  Gastrointestinal: Negative for nausea, vomiting, abdominal pain, diarrhea, constipation, blood in stool and abdominal distention.  Genitourinary: Negative for dysuria, urgency, frequency, hematuria, flank pain, vaginal bleeding, vaginal discharge, difficulty urinating, vaginal pain and pelvic pain.  Musculoskeletal: Negative for joint swelling, arthralgias and gait problem.  Skin: Negative for rash.  Neurological: Negative for dizziness, syncope, speech difficulty, weakness, numbness and headaches.  Hematological: Negative for adenopathy.  Psychiatric/Behavioral: Negative for behavioral problems, dysphoric mood and agitation. The patient is not nervous/anxious.        Objective:   Physical Exam  Constitutional: She is oriented to person, place, and time. She  appears well-developed and well-nourished.  HENT:  Head: Normocephalic.  Right Ear: External ear normal.  Left Ear: External ear normal.  Mouth/Throat: Oropharynx is clear and moist.  Wax in both canals irrigated until clear  Eyes: Conjunctivae and EOM are normal. Pupils are equal, round, and reactive to light.  Neck: Normal range of motion. Neck supple. No thyromegaly present.  Cardiovascular: Normal rate, regular rhythm, normal heart sounds and intact distal pulses.   Pulmonary/Chest: Effort normal and breath sounds normal.    Abdominal: She exhibits no mass. There is no tenderness.  Lymphadenopathy:    She has no cervical adenopathy.  Neurological: She is alert and oriented to person, place, and time.  Skin: Skin is warm and dry. No rash noted.  Psychiatric: She has a normal mood and affect. Her behavior is normal.          Assessment & Plan:   Bilateral cerumen impactions.  Both canals irrigated until clear  Return here when necessary

## 2014-11-06 NOTE — Progress Notes (Signed)
Pre visit review using our clinic review tool, if applicable. No additional management support is needed unless otherwise documented below in the visit note. 

## 2015-03-27 ENCOUNTER — Ambulatory Visit (INDEPENDENT_AMBULATORY_CARE_PROVIDER_SITE_OTHER): Payer: PRIVATE HEALTH INSURANCE | Admitting: Internal Medicine

## 2015-03-27 ENCOUNTER — Encounter: Payer: Self-pay | Admitting: Internal Medicine

## 2015-03-27 VITALS — BP 98/70 | HR 60 | Temp 98.4°F | Ht 66.0 in | Wt 139.5 lb

## 2015-03-27 DIAGNOSIS — E785 Hyperlipidemia, unspecified: Secondary | ICD-10-CM

## 2015-03-27 DIAGNOSIS — Z23 Encounter for immunization: Secondary | ICD-10-CM

## 2015-03-27 NOTE — Patient Instructions (Addendum)
Health Maintenance, Female Adopting a healthy lifestyle and getting preventive care can go a long way to promote health and wellness. Talk with your health care provider about what schedule of regular examinations is right for you. This is a good chance for you to check in with your provider about disease prevention and staying healthy. In between checkups, there are plenty of things you can do on your own. Experts have done a lot of research about which lifestyle changes and preventive measures are most likely to keep you healthy. Ask your health care provider for more information. WEIGHT AND DIET  Eat a healthy diet  Be sure to include plenty of vegetables, fruits, low-fat dairy products, and lean protein.  Do not eat a lot of foods high in solid fats, added sugars, or salt.  Get regular exercise. This is one of the most important things you can do for your health.  Most adults should exercise for at least 150 minutes each week. The exercise should increase your heart rate and make you sweat (moderate-intensity exercise).  Most adults should also do strengthening exercises at least twice a week. This is in addition to the moderate-intensity exercise.  Maintain a healthy weight  Body mass index (BMI) is a measurement that can be used to identify possible weight problems. It estimates body fat based on height and weight. Your health care provider can help determine your BMI and help you achieve or maintain a healthy weight.  For females 20 years of age and older:   A BMI below 18.5 is considered underweight.  A BMI of 18.5 to 24.9 is normal.  A BMI of 25 to 29.9 is considered overweight.  A BMI of 30 and above is considered obese.  Watch levels of cholesterol and blood lipids  You should start having your blood tested for lipids and cholesterol at 36 years of age, then have this test every 5 years.  You may need to have your cholesterol levels checked more often if:  Your lipid  or cholesterol levels are high.  You are older than 36 years of age.  You are at high risk for heart disease.  CANCER SCREENING   Lung Cancer  Lung cancer screening is recommended for adults 55-80 years old who are at high risk for lung cancer because of a history of smoking.  A yearly low-dose CT scan of the lungs is recommended for people who:  Currently smoke.  Have quit within the past 15 years.  Have at least a 30-pack-year history of smoking. A pack year is smoking an average of one pack of cigarettes a day for 1 year.  Yearly screening should continue until it has been 15 years since you quit.  Yearly screening should stop if you develop a health problem that would prevent you from having lung cancer treatment.  Breast Cancer  Practice breast self-awareness. This means understanding how your breasts normally appear and feel.  It also means doing regular breast self-exams. Let your health care provider know about any changes, no matter how small.  If you are in your 20s or 30s, you should have a clinical breast exam (CBE) by a health care provider every 1-3 years as part of a regular health exam.  If you are 40 or older, have a CBE every year. Also consider having a breast X-ray (mammogram) every year.  If you have a family history of breast cancer, talk to your health care provider about genetic screening.  If you   are at high risk for breast cancer, talk to your health care provider about having an MRI and a mammogram every year.  Breast cancer gene (BRCA) assessment is recommended for women who have family members with BRCA-related cancers. BRCA-related cancers include:  Breast.  Ovarian.  Tubal.  Peritoneal cancers.  Results of the assessment will determine the need for genetic counseling and BRCA1 and BRCA2 testing. Cervical Cancer Your health care provider may recommend that you be screened regularly for cancer of the pelvic organs (ovaries, uterus, and  vagina). This screening involves a pelvic examination, including checking for microscopic changes to the surface of your cervix (Pap test). You may be encouraged to have this screening done every 3 years, beginning at age 21.  For women ages 30-65, health care providers may recommend pelvic exams and Pap testing every 3 years, or they may recommend the Pap and pelvic exam, combined with testing for human papilloma virus (HPV), every 5 years. Some types of HPV increase your risk of cervical cancer. Testing for HPV may also be done on women of any age with unclear Pap test results.  Other health care providers may not recommend any screening for nonpregnant women who are considered low risk for pelvic cancer and who do not have symptoms. Ask your health care provider if a screening pelvic exam is right for you.  If you have had past treatment for cervical cancer or a condition that could lead to cancer, you need Pap tests and screening for cancer for at least 20 years after your treatment. If Pap tests have been discontinued, your risk factors (such as having a new sexual partner) need to be reassessed to determine if screening should resume. Some women have medical problems that increase the chance of getting cervical cancer. In these cases, your health care provider may recommend more frequent screening and Pap tests. Colorectal Cancer  This type of cancer can be detected and often prevented.  Routine colorectal cancer screening usually begins at 36 years of age and continues through 36 years of age.  Your health care provider may recommend screening at an earlier age if you have risk factors for colon cancer.  Your health care provider may also recommend using home test kits to check for hidden blood in the stool.  A small camera at the end of a tube can be used to examine your colon directly (sigmoidoscopy or colonoscopy). This is done to check for the earliest forms of colorectal  cancer.  Routine screening usually begins at age 50.  Direct examination of the colon should be repeated every 5-10 years through 36 years of age. However, you may need to be screened more often if early forms of precancerous polyps or small growths are found. Skin Cancer  Check your skin from head to toe regularly.  Tell your health care provider about any new moles or changes in moles, especially if there is a change in a mole's shape or color.  Also tell your health care provider if you have a mole that is larger than the size of a pencil eraser.  Always use sunscreen. Apply sunscreen liberally and repeatedly throughout the day.  Protect yourself by wearing long sleeves, pants, a wide-brimmed hat, and sunglasses whenever you are outside. HEART DISEASE, DIABETES, AND HIGH BLOOD PRESSURE   High blood pressure causes heart disease and increases the risk of stroke. High blood pressure is more likely to develop in:  People who have blood pressure in the high end   of the normal range (130-139/85-89 mm Hg).  People who are overweight or obese.  People who are African American.  If you are 38-23 years of age, have your blood pressure checked every 3-5 years. If you are 61 years of age or older, have your blood pressure checked every year. You should have your blood pressure measured twice--once when you are at a hospital or clinic, and once when you are not at a hospital or clinic. Record the average of the two measurements. To check your blood pressure when you are not at a hospital or clinic, you can use:  An automated blood pressure machine at a pharmacy.  A home blood pressure monitor.  If you are between 45 years and 39 years old, ask your health care provider if you should take aspirin to prevent strokes.  Have regular diabetes screenings. This involves taking a blood sample to check your fasting blood sugar level.  If you are at a normal weight and have a low risk for diabetes,  have this test once every three years after 36 years of age.  If you are overweight and have a high risk for diabetes, consider being tested at a younger age or more often. PREVENTING INFECTION  Hepatitis B  If you have a higher risk for hepatitis B, you should be screened for this virus. You are considered at high risk for hepatitis B if:  You were born in a country where hepatitis B is common. Ask your health care provider which countries are considered high risk.  Your parents were born in a high-risk country, and you have not been immunized against hepatitis B (hepatitis B vaccine).  You have HIV or AIDS.  You use needles to inject street drugs.  You live with someone who has hepatitis B.  You have had sex with someone who has hepatitis B.  You get hemodialysis treatment.  You take certain medicines for conditions, including cancer, organ transplantation, and autoimmune conditions. Hepatitis C  Blood testing is recommended for:  Everyone born from [birth year] through 1967.  Anyone with known risk factors for hepatitis C. Sexually transmitted infections (STIs)  You should be screened for sexually transmitted infections (STIs) including gonorrhea and chlamydia if:  You are sexually active and are younger than 36 years of age.  You are older than 36 years of age and your health care provider tells you that you are at risk for this type of infection.  Your sexual activity has changed since you were last screened and you are at an increased risk for chlamydia or gonorrhea. Ask your health care provider if you are at risk.  If you do not have HIV, but are at risk, it may be recommended that you take a prescription medicine daily to prevent HIV infection. This is called pre-exposure prophylaxis (PrEP). You are considered at risk if:  You are sexually active and do not regularly use condoms or know the HIV status of your partner(s).  You take drugs by injection.  You are sexually  active with a partner who has HIV. Talk with your health care provider about whether you are at high risk of being infected with HIV. If you choose to begin PrEP, you should first be tested for HIV. You should then be tested every 3 months for as long as you are taking PrEP.  PREGNANCY   If you are premenopausal and you may become pregnant, ask your health care provider about preconception counseling.  If you may  become pregnant, take 400 to 800 micrograms (mcg) of folic acid every day.  If you want to prevent pregnancy, talk to your health care provider about birth control (contraception). OSTEOPOROSIS AND MENOPAUSE   Osteoporosis is a disease in which the bones lose minerals and strength with aging. This can result in serious bone fractures. Your risk for osteoporosis can be identified using a bone density scan.  If you are 24 years of age or older, or if you are at risk for osteoporosis and fractures, ask your health care provider if you should be screened.  Ask your health care provider whether you should take a calcium or vitamin D supplement to lower your risk for osteoporosis.  Menopause may have certain physical symptoms and risks.  Hormone replacement therapy may reduce some of these symptoms and risks. Talk to your health care provider about whether hormone replacement therapy is right for you.  HOME CARE INSTRUCTIONS   Schedule regular health, dental, and eye exams.  Stay current with your immunizations.   Do not use any tobacco products including cigarettes, chewing tobacco, or electronic cigarettes.  If you are pregnant, do not drink alcohol.  If you are breastfeeding, limit how much and how often you drink alcohol.  Limit alcohol intake to no more than 1 drink per day for nonpregnant women. One drink equals 12 ounces of beer, 5 ounces of wine, or 1 ounces of hard liquor.  Do not use street drugs.  Do not share needles.  Ask your health care provider for help if  you need support or information about quitting drugs.  Tell your health care provider if you often feel depressed.  Tell your health care provider if you have ever been abused or do not feel safe at home.   This information is not intended to replace advice given to you by your health care provider. Make sure you discuss any questions you have with your health care provider.   Document Released: 12/13/2010 Document Revised: 06/20/2014 Document Reviewed: 05/01/2013 Elsevier Interactive Patient Education 2016 [place]. Heart-Healthy Eating Plan Many factors influence your heart health, including eating and exercise habits. Heart (coronary) risk increases with abnormal blood fat (lipid) levels. Heart-healthy meal planning includes limiting unhealthy fats, increasing healthy fats, and making other small dietary changes. This includes maintaining a healthy body weight to help keep lipid levels within a normal range. WHAT IS MY PLAN?  Your health care provider recommends that you:  Get no more than _________% of the total calories in your daily diet from fat.  Limit your intake of saturated fat to less than _________% of your total calories each day.  Limit the amount of cholesterol in your diet to less than _________ mg per day. WHAT TYPES OF FAT SHOULD I CHOOSE?  Choose healthy fats more often. Choose monounsaturated and polyunsaturated fats, such as olive oil and canola oil, flaxseeds, walnuts, almonds, and seeds.  Eat more omega-3 fats. Good choices include salmon, mackerel, sardines, tuna, flaxseed oil, and ground flaxseeds. Aim to eat fish at least two times each week.  Limit saturated fats. Saturated fats are primarily found in animal products, such as meats, butter, and cream. Plant sources of saturated fats include palm oil, palm kernel oil, and coconut oil.  Avoid foods with partially hydrogenated oils in them. These contain trans fats. Examples of foods that contain trans fats  are stick margarine, some tub margarines, cookies, crackers, and other baked goods. WHAT GENERAL GUIDELINES DO I NEED TO FOLLOW?  Check food labels carefully to identify foods with trans fats or high amounts of saturated fat.  Fill one half of your plate with vegetables and green salads. Eat 4-5 servings of vegetables per day. A serving of vegetables equals 1 cup of raw leafy vegetables,  cup of raw or cooked cut-up vegetables, or  cup of vegetable juice.  Fill one fourth of your plate with whole grains. Look for the word "whole" as the first word in the ingredient list.  Fill one fourth of your plate with lean protein foods.  Eat 4-5 servings of fruit per day. A serving of fruit equals one medium whole fruit,  cup of dried fruit,  cup of fresh, frozen, or canned fruit, or  cup of 100% fruit juice.  Eat more foods that contain soluble fiber. Examples of foods that contain this type of fiber are apples, broccoli, carrots, beans, peas, and barley. Aim to get 20-30 g of fiber per day.  Eat more home-cooked food and less restaurant, buffet, and fast food.  Limit or avoid alcohol.  Limit foods that are high in starch and sugar.  Avoid fried foods.  Cook foods by using methods other than frying. Baking, boiling, grilling, and broiling are all great options. Other fat-reducing suggestions include:  Removing the skin from poultry.  Removing all visible fats from meats.  Skimming the fat off of stews, soups, and gravies before serving them.  Steaming vegetables in water or broth.  Lose weight if you are overweight. Losing just 5-10% of your initial body weight can help your overall health and prevent diseases such as diabetes and heart disease.  Increase your consumption of nuts, legumes, and seeds to 4-5 servings per week. One serving of dried beans or legumes equals  cup after being cooked, one serving of nuts equals 1 ounces, and one serving of seeds equals  ounce or 1  tablespoon.  You may need to monitor your salt (sodium) intake, especially if you have high blood pressure. Talk with your health care provider or dietitian to get more information about reducing sodium. WHAT FOODS CAN I EAT? Grains Breads, including [place], white, pita, wheat, raisin, rye, oatmeal, and [place]. Tortillas that are neither fried nor made with lard or trans fat. Low-fat rolls, including hotdog and hamburger buns and English muffins. Biscuits. Muffins. Waffles. Pancakes. Light popcorn. Whole-grain cereals. Flatbread. Melba toast. Pretzels. Breadsticks. Rusks. Low-fat snacks and crackers, including oyster, saltine, matzo, graham, animal, and rye. Rice and pasta, including brown rice and those that are made with whole wheat. Vegetables All vegetables. Fruits All fruits, but limit coconut. Meats and Other Protein Sources Lean, well-trimmed beef, veal, pork, and lamb. Chicken and [place] without skin. All fish and shellfish. Wild duck, rabbit, pheasant, and venison. Egg whites or low-cholesterol egg substitutes. Dried beans, peas, lentils, and tofu.Seeds and most nuts. Dairy Low-fat or nonfat cheeses, including ricotta, string, and mozzarella. Skim or 1% milk that is liquid, powdered, or evaporated. Buttermilk that is made with low-fat milk. Nonfat or low-fat yogurt. Beverages Mineral water. Diet carbonated beverages. Sweets and Desserts Sherbets and fruit ices. Honey, jam, marmalade, jelly, and syrups. Meringues and gelatins. Pure sugar candy, such as hard candy, jelly beans, gumdrops, mints, marshmallows, and small amounts of dark chocolate. [employer] Eat all sweets and desserts in moderation. Fats and Oils Nonhydrogenated (trans-free) margarines. Vegetable oils, including soybean, sesame, sunflower, olive, peanut, safflower, corn, canola, and cottonseed. Salad dressings or mayonnaise that are made with a vegetable oil. Limit  added fats and oils that you use for cooking,  baking, salads, and as spreads. Other Cocoa powder. Coffee and tea. All seasonings and condiments. The items listed above may not be a complete list of recommended foods or beverages. Contact your dietitian for more options. WHAT FOODS ARE NOT RECOMMENDED? Grains Breads that are made with saturated or trans fats, oils, or whole milk. Croissants. Butter rolls. Cheese breads. Sweet rolls. Donuts. Buttered popcorn. Chow mein noodles. High-fat crackers, such as cheese or butter crackers. Meats and Other Protein Sources Fatty meats, such as hotdogs, short ribs, sausage, spareribs, bacon, ribeye roast or steak, and mutton. High-fat deli meats, such as salami and bologna. Caviar. Domestic duck and goose. Organ meats, such as kidney, liver, sweetbreads, brains, gizzard, chitterlings, and heart. Dairy Cream, sour cream, cream cheese, and creamed cottage cheese. Whole milk cheeses, including blue (bleu), Monte[place], [place], [place], [place], [place], [place], [place], [place], and [place]. Whole or 2% milk that is liquid, evaporated, or condensed. Whole buttermilk. Cream sauce or high-fat cheese sauce. Yogurt that is made from whole milk. Beverages Regular sodas and drinks with added sugar. Sweets and Desserts Frosting. Pudding. Cookies. Cakes other than angel food cake. Candy that has milk chocolate or white chocolate, hydrogenated fat, butter, coconut, or unknown ingredients. Buttered syrups. Full-fat ice cream or ice cream drinks. Fats and Oils Gravy that has suet, meat fat, or shortening. Cocoa butter, hydrogenated oils, palm oil, coconut oil, palm kernel oil. These can often be found in baked products, candy, fried foods, nondairy creamers, and whipped toppings. Solid fats and shortenings, including bacon fat, salt pork, lard, and butter. Nondairy cream substitutes, such as coffee creamers and sour cream substitutes. Salad dressings that are made of unknown oils, cheese, or sour cream. The items listed  above may not be a complete list of foods and beverages to avoid. Contact your dietitian for more information.   This information is not intended to replace advice given to you by your health care provider. Make sure you discuss any questions you have with your health care provider.   Document Released: 03/08/2008 Document Revised: 06/20/2014 Document Reviewed: 11/21/2013 Elsevier Interactive Patient Education 2016 [place]. Heart-Healthy Eating Plan Many factors influence your heart health, including eating and exercise habits. Heart (coronary) risk increases with abnormal blood fat (lipid) levels. Heart-healthy meal planning includes limiting unhealthy fats, increasing healthy fats, and making other small dietary changes. This includes maintaining a healthy body weight to help keep lipid levels within a normal range. WHAT IS MY PLAN?  Your health care provider recommends that you:  Get no more than _________% of the total calories in your daily diet from fat.  Limit your intake of saturated fat to less than _________% of your total calories each day.  Limit the amount of cholesterol in your diet to less than _________ mg per day. WHAT TYPES OF FAT SHOULD I CHOOSE?  Choose healthy fats more often. Choose monounsaturated and polyunsaturated fats, such as olive oil and canola oil, flaxseeds, walnuts, almonds, and seeds.  Eat more omega-3 fats. Good choices include salmon, mackerel, sardines, tuna, flaxseed oil, and ground flaxseeds. Aim to eat fish at least two times each week.  Limit saturated fats. Saturated fats are primarily found in animal products, such as meats, butter, and cream. Plant sources of saturated fats include palm oil, palm kernel oil, and coconut oil.  Avoid foods with partially hydrogenated oils in them. These contain trans fats. Examples of foods that contain trans fats are  stick margarine, some tub margarines, cookies, crackers, and other baked goods. WHAT GENERAL  GUIDELINES DO I NEED TO FOLLOW?  Check food labels carefully to identify foods with trans fats or high amounts of saturated fat.  Fill one half of your plate with vegetables and green salads. Eat 4-5 servings of vegetables per day. A serving of vegetables equals 1 cup of raw leafy vegetables,  cup of raw or cooked cut-up vegetables, or  cup of vegetable juice.  Fill one fourth of your plate with whole grains. Look for the word "whole" as the first word in the ingredient list.  Fill one fourth of your plate with lean protein foods.  Eat 4-5 servings of fruit per day. A serving of fruit equals one medium whole fruit,  cup of dried fruit,  cup of fresh, frozen, or canned fruit, or  cup of 100% fruit juice.  Eat more foods that contain soluble fiber. Examples of foods that contain this type of fiber are apples, broccoli, carrots, beans, peas, and barley. Aim to get 20-30 g of fiber per day.  Eat more home-cooked food and less restaurant, buffet, and fast food.  Limit or avoid alcohol.  Limit foods that are high in starch and sugar.  Avoid fried foods.  Cook foods by using methods other than frying. Baking, boiling, grilling, and broiling are all great options. Other fat-reducing suggestions include:  Removing the skin from poultry.  Removing all visible fats from meats.  Skimming the fat off of stews, soups, and gravies before serving them.  Steaming vegetables in water or broth.  Lose weight if you are overweight. Losing just 5-10% of your initial body weight can help your overall health and prevent diseases such as diabetes and heart disease.  Increase your consumption of nuts, legumes, and seeds to 4-5 servings per week. One serving of dried beans or legumes equals  cup after being cooked, one serving of nuts equals 1 ounces, and one serving of seeds equals  ounce or 1 tablespoon.  You may need to monitor your salt (sodium) intake, especially if you have high blood  pressure. Talk with your health care provider or dietitian to get more information about reducing sodium. WHAT FOODS CAN I EAT? Grains Breads, including [place], white, pita, wheat, raisin, rye, oatmeal, and [place]. Tortillas that are neither fried nor made with lard or trans fat. Low-fat rolls, including hotdog and hamburger buns and English muffins. Biscuits. Muffins. Waffles. Pancakes. Light popcorn. Whole-grain cereals. Flatbread. Melba toast. Pretzels. Breadsticks. Rusks. Low-fat snacks and crackers, including oyster, saltine, matzo, graham, animal, and rye. Rice and pasta, including brown rice and those that are made with whole wheat. Vegetables All vegetables. Fruits All fruits, but limit coconut. Meats and Other Protein Sources Lean, well-trimmed beef, veal, pork, and lamb. Chicken and [place] without skin. All fish and shellfish. Wild duck, rabbit, pheasant, and venison. Egg whites or low-cholesterol egg substitutes. Dried beans, peas, lentils, and tofu.Seeds and most nuts. Dairy Low-fat or nonfat cheeses, including ricotta, string, and mozzarella. Skim or 1% milk that is liquid, powdered, or evaporated. Buttermilk that is made with low-fat milk. Nonfat or low-fat yogurt. Beverages Mineral water. Diet carbonated beverages. Sweets and Desserts Sherbets and fruit ices. Honey, jam, marmalade, jelly, and syrups. Meringues and gelatins. Pure sugar candy, such as hard candy, jelly beans, gumdrops, mints, marshmallows, and small amounts of dark chocolate. [employer] Eat all sweets and desserts in moderation. Fats and Oils Nonhydrogenated (trans-free) margarines. Vegetable oils, including soybean,  sesame, sunflower, olive, peanut, safflower, corn, canola, and cottonseed. Salad dressings or mayonnaise that are made with a vegetable oil. Limit added fats and oils that you use for cooking, baking, salads, and as spreads. Other Cocoa powder. Coffee and tea. All seasonings and condiments. The  items listed above may not be a complete list of recommended foods or beverages. Contact your dietitian for more options. WHAT FOODS ARE NOT RECOMMENDED? Grains Breads that are made with saturated or trans fats, oils, or whole milk. Croissants. Butter rolls. Cheese breads. Sweet rolls. Donuts. Buttered popcorn. Chow mein noodles. High-fat crackers, such as cheese or butter crackers. Meats and Other Protein Sources Fatty meats, such as hotdogs, short ribs, sausage, spareribs, bacon, ribeye roast or steak, and mutton. High-fat deli meats, such as salami and bologna. Caviar. Domestic duck and goose. Organ meats, such as kidney, liver, sweetbreads, brains, gizzard, chitterlings, and heart. Dairy Cream, sour cream, cream cheese, and creamed cottage cheese. Whole milk cheeses, including blue (bleu), Monte[place] Jack, [place], [place], American, [place], Swiss, [place], [place], and [place]. Whole or 2% milk that is liquid, evaporated, or condensed. Whole buttermilk. Cream sauce or high-fat cheese sauce. Yogurt that is made from whole milk. Beverages Regular sodas and drinks with added sugar. Sweets and Desserts Frosting. Pudding. Cookies. Cakes other than angel food cake. Candy that has milk chocolate or white chocolate, hydrogenated fat, butter, coconut, or unknown ingredients. Buttered syrups. Full-fat ice cream or ice cream drinks. Fats and Oils Gravy that has suet, meat fat, or shortening. Cocoa butter, hydrogenated oils, palm oil, coconut oil, palm kernel oil. These can often be found in baked products, candy, fried foods, nondairy creamers, and whipped toppings. Solid fats and shortenings, including bacon fat, salt pork, lard, and butter. Nondairy cream substitutes, such as coffee creamers and sour cream substitutes. Salad dressings that are made of unknown oils, cheese, or sour cream. The items listed above may not be a complete list of foods and beverages to avoid. Contact your dietitian for more  information.   This information is not intended to replace advice given to you by your health care provider. Make sure you discuss any questions you have with your health care provider.   Document Released: 03/08/2008 Document Revised: 06/20/2014 Document Reviewed: 11/21/2013 Elsevier Interactive Patient Education 2016 [place]. Heart-Healthy Eating Plan Many factors influence your heart health, including eating and exercise habits. Heart (coronary) risk increases with abnormal blood fat (lipid) levels. Heart-healthy meal planning includes limiting unhealthy fats, increasing healthy fats, and making other small dietary changes. This includes maintaining a healthy body weight to help keep lipid levels within a normal range. WHAT IS MY PLAN?  Your health care provider recommends that you:  Get no more than _________% of the total calories in your daily diet from fat.  Limit your intake of saturated fat to less than _________% of your total calories each day.  Limit the amount of cholesterol in your diet to less than _________ mg per day. WHAT TYPES OF FAT SHOULD I CHOOSE?  Choose healthy fats more often. Choose monounsaturated and polyunsaturated fats, such as olive oil and canola oil, flaxseeds, walnuts, almonds, and seeds.  Eat more omega-3 fats. Good choices include salmon, mackerel, sardines, tuna, flaxseed oil, and ground flaxseeds. Aim to eat fish at least two times each week.  Limit saturated fats. Saturated fats are primarily found in animal products, such as meats, butter, and cream. Plant sources of saturated fats include palm oil, palm kernel oil, and coconut oil.  Avoid foods with partially hydrogenated oils in them. These contain trans fats. Examples of foods that contain trans fats are stick margarine, some tub margarines, cookies, crackers, and other baked goods. WHAT GENERAL GUIDELINES DO I NEED TO FOLLOW?  Check food labels carefully to identify foods with trans fats or  high amounts of saturated fat.  Fill one half of your plate with vegetables and green salads. Eat 4-5 servings of vegetables per day. A serving of vegetables equals 1 cup of raw leafy vegetables,  cup of raw or cooked cut-up vegetables, or  cup of vegetable juice.  Fill one fourth of your plate with whole grains. Look for the word "whole" as the first word in the ingredient list.  Fill one fourth of your plate with lean protein foods.  Eat 4-5 servings of fruit per day. A serving of fruit equals one medium whole fruit,  cup of dried fruit,  cup of fresh, frozen, or canned fruit, or  cup of 100% fruit juice.  Eat more foods that contain soluble fiber. Examples of foods that contain this type of fiber are apples, broccoli, carrots, beans, peas, and barley. Aim to get 20-30 g of fiber per day.  Eat more home-cooked food and less restaurant, buffet, and fast food.  Limit or avoid alcohol.  Limit foods that are high in starch and sugar.  Avoid fried foods.  Cook foods by using methods other than frying. Baking, boiling, grilling, and broiling are all great options. Other fat-reducing suggestions include:  Removing the skin from poultry.  Removing all visible fats from meats.  Skimming the fat off of stews, soups, and gravies before serving them.  Steaming vegetables in water or broth.  Lose weight if you are overweight. Losing just 5-10% of your initial body weight can help your overall health and prevent diseases such as diabetes and heart disease.  Increase your consumption of nuts, legumes, and seeds to 4-5 servings per week. One serving of dried beans or legumes equals  cup after being cooked, one serving of nuts equals 1 ounces, and one serving of seeds equals  ounce or 1 tablespoon.  You may need to monitor your salt (sodium) intake, especially if you have high blood pressure. Talk with your health care provider or dietitian to get more information about reducing  sodium. WHAT FOODS CAN I EAT? Grains Breads, including [place], white, pita, wheat, raisin, rye, oatmeal, and [place]. Tortillas that are neither fried nor made with lard or trans fat. Low-fat rolls, including hotdog and hamburger buns and English muffins. Biscuits. Muffins. Waffles. Pancakes. Light popcorn. Whole-grain cereals. Flatbread. Melba toast. Pretzels. Breadsticks. Rusks. Low-fat snacks and crackers, including oyster, saltine, matzo, graham, animal, and rye. Rice and pasta, including brown rice and those that are made with whole wheat. Vegetables All vegetables. Fruits All fruits, but limit coconut. Meats and Other Protein Sources Lean, well-trimmed beef, veal, pork, and lamb. Chicken and [place] without skin. All fish and shellfish. Wild duck, rabbit, pheasant, and venison. Egg whites or low-cholesterol egg substitutes. Dried beans, peas, lentils, and tofu.Seeds and most nuts. Dairy Low-fat or nonfat cheeses, including ricotta, string, and mozzarella. Skim or 1% milk that is liquid, powdered, or evaporated. Buttermilk that is made with low-fat milk. Nonfat or low-fat yogurt. Beverages Mineral water. Diet carbonated beverages. Sweets and Desserts Sherbets and fruit ices. Honey, jam, marmalade, jelly, and syrups. Meringues and gelatins. Pure sugar candy, such as hard candy, jelly beans, gumdrops, mints, marshmallows, and small amounts of dark chocolate.  [employer] Eat all sweets and desserts in moderation. Fats and Oils Nonhydrogenated (trans-free) margarines. Vegetable oils, including soybean, sesame, sunflower, olive, peanut, safflower, corn, canola, and cottonseed. Salad dressings or mayonnaise that are made with a vegetable oil. Limit added fats and oils that you use for cooking, baking, salads, and as spreads. Other Cocoa powder. Coffee and tea. All seasonings and condiments. The items listed above may not be a complete list of recommended foods or beverages. Contact your  dietitian for more options. WHAT FOODS ARE NOT RECOMMENDED? Grains Breads that are made with saturated or trans fats, oils, or whole milk. Croissants. Butter rolls. Cheese breads. Sweet rolls. Donuts. Buttered popcorn. Chow mein noodles. High-fat crackers, such as cheese or butter crackers. Meats and Other Protein Sources Fatty meats, such as hotdogs, short ribs, sausage, spareribs, bacon, ribeye roast or steak, and mutton. High-fat deli meats, such as salami and bologna. Caviar. Domestic duck and goose. Organ meats, such as kidney, liver, sweetbreads, brains, gizzard, chitterlings, and heart. Dairy Cream, sour cream, cream cheese, and creamed cottage cheese. Whole milk cheeses, including blue (bleu), Monte[place] Jack, [place], [place], American, [place], Swiss, [place], [place], and [place]. Whole or 2% milk that is liquid, evaporated, or condensed. Whole buttermilk. Cream sauce or high-fat cheese sauce. Yogurt that is made from whole milk. Beverages Regular sodas and drinks with added sugar. Sweets and Desserts Frosting. Pudding. Cookies. Cakes other than angel food cake. Candy that has milk chocolate or white chocolate, hydrogenated fat, butter, coconut, or unknown ingredients. Buttered syrups. Full-fat ice cream or ice cream drinks. Fats and Oils Gravy that has suet, meat fat, or shortening. Cocoa butter, hydrogenated oils, palm oil, coconut oil, palm kernel oil. These can often be found in baked products, candy, fried foods, nondairy creamers, and whipped toppings. Solid fats and shortenings, including bacon fat, salt pork, lard, and butter. Nondairy cream substitutes, such as coffee creamers and sour cream substitutes. Salad dressings that are made of unknown oils, cheese, or sour cream. The items listed above may not be a complete list of foods and beverages to avoid. Contact your dietitian for more information.   This information is not intended to replace advice given to you by your health care  provider. Make sure you discuss any questions you have with your health care provider.   Document Released: 03/08/2008 Document Revised: 06/20/2014 Document Reviewed: 11/21/2013 Elsevier Interactive Patient Education [employer].

## 2015-03-27 NOTE — Progress Notes (Signed)
Subjective:    Patient ID: Becky Howard, female    DOB: [birth year], 34 y.o.   MRN: [id number]  HPI  34 year old patient who is seen today for follow-up.  She has been seen by OB/GYN recently and lipid profile was obtained.  Total cholesterol 201 with LDL cholesterol 125.  Triglycerides were low at 58 and HDL cholesterol 64 Maternal grandFather had an MI at age 1.  Mother has treated dyslipidemia  No cardiovascular risk factors  Past Medical History  Diagnosis Date  . Headache(784.0)   . Medical history non-contributory   . Hx of varicella   . MTHFR mutation (HCC)   . Postpartum care following vaginal delivery (10/01/12) 10/02/2012  . SVD (spontaneous vaginal delivery) 10/02/2012    Social History   Social History  . Marital Status: Married    Spouse Name: N/A  . Number of Children: N/A  . Years of Education: N/A   Occupational History  . Not on file.   Social History Main Topics  . Smoking status: Never Smoker   . Smokeless tobacco: Never Used  . Alcohol Use: No  . Drug Use: No  . Sexual Activity: Not on file   Other Topics Concern  . Not on file   Social History Narrative    Past Surgical History  Procedure Laterality Date  . No past surgeries      Family History  Problem Relation Age of Onset  . Healthy Mother   . Polycystic kidney disease Father   . Hypertension Father   . Heart attack Brother   . Polycystic kidney disease Brother   . Heart attack Maternal Grandfather   . Heart attack Brother   . Heart attack Brother   . Cancer Maternal Grandmother     lymphoma, thyroid    No Known Allergies  Current Outpatient Prescriptions on File Prior to Visit  Medication Sig Dispense Refill  . ALPRAZolam (XANAX) 0.25 MG tablet Take 1 tablet (0.25 mg total) by mouth 2 (two) times daily as needed for anxiety. 30 tablet 0  . Clobetasol Propionate 0.05 % shampoo Apply topically to scalp once or twice daily 118 mL 3  . ibuprofen (ADVIL,MOTRIN) 600 MG  tablet Take 1 tablet (600 mg total) by mouth every 6 (six) hours. 30 tablet 1  . Multiple Vitamin (MULTIVITAMIN) tablet Take 1 tablet by mouth daily.    Marland Kitchen triamcinolone cream (KENALOG) 0.1 % Apply 1 application topically 2 (two) times daily. 30 g 0   No current facility-administered medications on file prior to visit.    BP 98/70 mmHg  Pulse 60  Temp(Src) 98.4 F (36.9 C) (Oral)  Ht  (1.676 m)  Wt 139 lb 8 oz (63.277 kg)  BMI 22.53 kg/m2     Review of Systems  Constitutional: Negative.   HENT: Negative for congestion, dental problem, hearing loss, rhinorrhea, sinus pressure, sore throat and tinnitus.   Eyes: Negative for pain, discharge and visual disturbance.  Respiratory: Negative for cough and shortness of breath.   Cardiovascular: Negative for chest pain, palpitations and leg swelling.  Gastrointestinal: Negative for nausea, vomiting, abdominal pain, diarrhea, constipation, blood in stool and abdominal distention.  Genitourinary: Negative for dysuria, urgency, frequency, hematuria, flank pain, vaginal bleeding, vaginal discharge, difficulty urinating, vaginal pain and pelvic pain.  Musculoskeletal: Negative for joint swelling, arthralgias and gait problem.  Skin: Negative for rash.  Neurological: Negative for dizziness, syncope, speech difficulty, weakness, numbness and headaches.  Hematological: Negative for adenopathy.  Psychiatric/Behavioral: Negative  for behavioral problems, dysphoric mood and agitation. The patient is not nervous/anxious.        Objective:   Physical Exam  Constitutional: She appears well-developed and well-nourished. No distress.  Blood pressure low normal          Assessment & Plan:   Mild elevation LDL cholesterol.  Guidelines for treating dyslipidemia.  Discussed at length.  We'll continue heart healthy diet and exercise program  Follow-up OB/GYN

## 2015-03-27 NOTE — Progress Notes (Signed)
Pre visit review using our clinic review tool, if applicable. No additional management support is needed unless otherwise documented below in the visit note. 

## 2016-03-15 ENCOUNTER — Encounter: Payer: Self-pay | Admitting: Family Medicine

## 2016-03-15 ENCOUNTER — Ambulatory Visit (INDEPENDENT_AMBULATORY_CARE_PROVIDER_SITE_OTHER): Payer: PRIVATE HEALTH INSURANCE | Admitting: Family Medicine

## 2016-03-15 VITALS — BP 112/56 | HR 69 | Temp 98.0°F | Ht 66.0 in | Wt 138.9 lb

## 2016-03-15 DIAGNOSIS — J0100 Acute maxillary sinusitis, unspecified: Secondary | ICD-10-CM

## 2016-03-15 DIAGNOSIS — Z23 Encounter for immunization: Secondary | ICD-10-CM | POA: Diagnosis not present

## 2016-03-15 MED ORDER — AMOXICILLIN-POT CLAVULANATE 875-125 MG PO TABS: 1.0000 | ORAL_TABLET | Freq: Two times a day (BID) | ORAL | 0 refills | Status: AC

## 2016-03-15 NOTE — Progress Notes (Signed)
Pre visit review using our clinic review tool, if applicable. No additional management support is needed unless otherwise documented below in the visit note. 

## 2016-03-15 NOTE — Progress Notes (Signed)
HPI:   Acute visit for sinus congestion: -started: 4 weeks ago -symptoms:nasal congestion, sore throat, cough - now with discolored nasal congestion (green/yellow) and some intermittent max sinus and tooth discomfort -denies:fever, SOB, NVD, rash, allergie sto abx -has tried: nasal saline and decongestant -sick contacts/travel/risks: no reported flu, strep or tick exposure  ROS: See pertinent positives and negatives per HPI.  Past Medical History:  Diagnosis Date  . Headache(784.0)   . Hx of varicella   . Medical history non-contributory   . MTHFR mutation (HCC)   . Postpartum care following vaginal delivery (10/01/12) 10/02/2012  . SVD (spontaneous vaginal delivery) 10/02/2012    Past Surgical History:  Procedure Laterality Date  . NO PAST SURGERIES      Family History  Problem Relation Age of Onset  . Healthy Mother   . Polycystic kidney disease Father   . Hypertension Father   . Heart attack Brother   . Polycystic kidney disease Brother   . Heart attack Maternal Grandfather   . Heart attack Brother   . Heart attack Brother   . Cancer Maternal Grandmother     lymphoma, thyroid    Social History   Social History  . Marital status: Married    Spouse name: N/A  . Number of children: N/A  . Years of education: N/A   Social History Main Topics  . Smoking status: Never Smoker  . Smokeless tobacco: Never Used  . Alcohol use No  . Drug use: No  . Sexual activity: Not Asked   Other Topics Concern  . None   Social History Narrative  . None     Current Outpatient Prescriptions:  .  ALPRAZolam (XANAX) 0.25 MG tablet, Take 1 tablet (0.25 mg total) by mouth 2 (two) times daily as needed for anxiety., Disp: 30 tablet, Rfl: 0 .  Clobetasol Propionate 0.05 % shampoo, Apply topically to scalp once or twice daily, Disp: 118 mL, Rfl: 3 .  ibuprofen (ADVIL,MOTRIN) 600 MG tablet, Take 1 tablet (600 mg total) by mouth every 6 (six) hours., Disp: 30 tablet, Rfl: 1 .   Multiple Vitamin (MULTIVITAMIN) tablet, Take 1 tablet by mouth daily., Disp: , Rfl:  .  triamcinolone cream (KENALOG) 0.1 %, Apply 1 application topically 2 (two) times daily., Disp: 30 g, Rfl: 0 .  amoxicillin-clavulanate (AUGMENTIN) 875-125 MG tablet, Take 1 tablet by mouth 2 (two) times daily., Disp: 20 tablet, Rfl: 0  EXAM:  Vitals:   03/15/16 1553  BP: (!) 112/56  Pulse: 69  Temp: 98 F (36.7 C)    Body mass index is 22.42 kg/m.  GENERAL: vitals reviewed and listed above, alert, oriented, appears well hydrated and in no acute distress  HEENT: atraumatic, conjunttiva clear, no obvious abnormalities on inspection of external nose and ears, normal appearance of ear canals and TMs, thick nasal congestion, mild post oropharyngeal erythema with PND, no tonsillar edema or exudate, no sinus TTP  NECK: no obvious masses on inspection  LUNGS: clear to auscultation bilaterally, no wheezes, rales or rhonchi, good air movement  CV: HRRR, no peripheral edema  MS: moves all extremities without noticeable abnormality  PSYCH: pleasant and cooperative, no obvious depression or anxiety  ASSESSMENT AND PLAN:  Discussed the following assessment and plan:  Acute maxillary sinusitis, recurrence not specified  We discussed potential etiologies, with viral vs bacterial sinusitis being most likely.We discussed treatment side effects, likely course,  transmission, and signs of developing a serious illness. She opted for treatment with augmentin  after discussion risks. -of course, we advised to return or notify a doctor immediately if symptoms worsen or persist or new concerns arise.    Patient Instructions  Sinusitis, Adult Sinusitis is redness, soreness, and inflammation of the paranasal sinuses. Paranasal sinuses are air pockets within the bones of your face. They are located beneath your eyes, in the middle of your forehead, and above your eyes. In healthy paranasal sinuses, mucus is able to  drain out, and air is able to circulate through them by way of your nose. However, when your paranasal sinuses are inflamed, mucus and air can become trapped. This can allow bacteria and other germs to grow and cause infection. Sinusitis can develop quickly and last only a short time (acute) or continue over a long period (chronic). Sinusitis that lasts for more than 12 weeks is considered chronic. CAUSES Causes of sinusitis include:  Allergies.  Structural abnormalities, such as displacement of the cartilage that separates your nostrils (deviated septum), which can decrease the air flow through your nose and sinuses and affect sinus drainage.  Functional abnormalities, such as when the small hairs (cilia) that line your sinuses and help remove mucus do not work properly or are not present. SIGNS AND SYMPTOMS Symptoms of acute and chronic sinusitis are the same. The primary symptoms are pain and pressure around the affected sinuses. Other symptoms include:  Upper toothache.  Earache.  Headache.  Bad breath.  Decreased sense of smell and taste.  A cough, which worsens when you are lying flat.  Fatigue.  Fever.  Thick drainage from your nose, which often is green and may contain pus (purulent).  Swelling and warmth over the affected sinuses. DIAGNOSIS Your health care provider will perform a physical exam. During your exam, your health care provider may perform any of the following to help determine if you have acute sinusitis or chronic sinusitis:  Look in your nose for signs of abnormal growths in your nostrils (nasal polyps).  Tap over the affected sinus to check for signs of infection.  View the inside of your sinuses using an imaging device that has a light attached (endoscope). If your health care provider suspects that you have chronic sinusitis, one or more of the following tests may be recommended:  Allergy tests.  Nasal culture. A sample of mucus is taken from your  nose, sent to a lab, and screened for bacteria.  Nasal cytology. A sample of mucus is taken from your nose and examined by your health care provider to determine if your sinusitis is related to an allergy. TREATMENT Most cases of acute sinusitis are related to a viral infection and will resolve on their own within 10 days. Sometimes, medicines are prescribed to help relieve symptoms of both acute and chronic sinusitis. These may include pain medicines, decongestants, nasal steroid sprays, or saline sprays. However, for sinusitis related to a bacterial infection, your health care provider will prescribe antibiotic medicines. These are medicines that will help kill the bacteria causing the infection. Rarely, sinusitis is caused by a fungal infection. In these cases, your health care provider will prescribe antifungal medicine. For some cases of chronic sinusitis, surgery is needed. Generally, these are cases in which sinusitis recurs more than 3 times per year, despite other treatments. HOME CARE INSTRUCTIONS  Drink plenty of water. Water helps thin the mucus so your sinuses can drain more easily.  Use a humidifier.  Inhale steam 3-4 times a day (for example, sit in the bathroom with the  shower running).  Apply a warm, moist washcloth to your face 3-4 times a day, or as directed by your health care provider.  Use saline nasal sprays to help moisten and clean your sinuses.  Take medicines only as directed by your health care provider.  If you were prescribed either an antibiotic or antifungal medicine, finish it all even if you start to feel better. SEEK IMMEDIATE MEDICAL CARE IF:  You have increasing pain or severe headaches.  You have nausea, vomiting, or drowsiness.  You have swelling around your face.  You have vision problems.  You have a stiff neck.  You have difficulty breathing.   This information is not intended to replace advice given to you by your health care provider.  Make sure you discuss any questions you have with your health care provider.   Document Released: 05/30/2005 Document Revised: 06/20/2014 Document Reviewed: 06/14/2011 Elsevier Interactive Patient Education [address]2016 [employer]    HerrinKIM, Dahlia ClientHANNAH R., DO

## 2016-03-15 NOTE — Patient Instructions (Signed)

## 2016-08-01 ENCOUNTER — Ambulatory Visit (INDEPENDENT_AMBULATORY_CARE_PROVIDER_SITE_OTHER): Payer: PRIVATE HEALTH INSURANCE | Admitting: Internal Medicine

## 2016-08-01 ENCOUNTER — Encounter: Payer: Self-pay | Admitting: Internal Medicine

## 2016-08-01 VITALS — BP 118/62 | HR 62 | Temp 97.9°F | Ht 66.0 in | Wt 140.6 lb

## 2016-08-01 DIAGNOSIS — H6123 Impacted cerumen, bilateral: Secondary | ICD-10-CM | POA: Diagnosis not present

## 2016-08-01 MED ORDER — CARBAMIDE PEROXIDE 6.5 % OT SOLN
5.0000 [drp] | Freq: Once | OTIC | Status: AC
  Administered 2016-08-01: 5 [drp] via OTIC

## 2016-08-01 MED ORDER — SCOPOLAMINE 1 MG/3DAYS TD PT72: 1.0000 | MEDICATED_PATCH | TRANSDERMAL | 12 refills | Status: AC

## 2016-08-01 NOTE — Progress Notes (Signed)
   Subjective:    Patient ID: Becky Howard, female    DOB: [birth year], 36 y.o.   MRN: [id number]  HPI  36 year old patient who has frequent issues with cerumen impactions.  She and her [place] will be leaving [place] in the near future and wishes the irrigation. She has a issue with motion sickness and is requesting treatment  Past Medical History:  Diagnosis Date  . Headache(784.0)   . Hx of varicella   . Medical history non-contributory   . MTHFR mutation (HCC)   . Postpartum care following vaginal delivery (10/01/12) 10/02/2012  . SVD (spontaneous vaginal delivery) 10/02/2012     Social History   Social History  . Marital status: Married    Spouse name: N/A  . Number of children: N/A  . Years of education: N/A   Occupational History  . Not on file.   Social History Main Topics  . Smoking status: Never Smoker  . Smokeless tobacco: Never Used  . Alcohol use No  . Drug use: No  . Sexual activity: Not on file   Other Topics Concern  . Not on file   Social History Narrative  . No narrative on file    Past Surgical History:  Procedure Laterality Date  . NO PAST SURGERIES      Family History  Problem Relation Age of Onset  . Healthy Mother   . Polycystic kidney disease Father   . Hypertension Father   . Heart attack Brother   . Polycystic kidney disease Brother   . Heart attack Maternal Grandfather   . Heart attack Brother   . Heart attack Brother   . Cancer Maternal Grandmother     lymphoma, thyroid    No Known Allergies  Current Outpatient Prescriptions on File Prior to Visit  Medication Sig Dispense Refill  . ALPRAZolam (XANAX) 0.25 MG tablet Take 1 tablet (0.25 mg total) by mouth 2 (two) times daily as needed for anxiety. 30 tablet 0  . amoxicillin-clavulanate (AUGMENTIN) 875-125 MG tablet Take 1 tablet by mouth 2 (two) times daily. 20 tablet 0  . Clobetasol Propionate 0.05 % shampoo Apply topically to scalp once or twice daily 118  mL 3  . ibuprofen (ADVIL,MOTRIN) 600 MG tablet Take 1 tablet (600 mg total) by mouth every 6 (six) hours. 30 tablet 1  . Multiple Vitamin (MULTIVITAMIN) tablet Take 1 tablet by mouth daily.    Marland Kitchen. triamcinolone cream (KENALOG) 0.1 % Apply 1 application topically 2 (two) times daily. 30 g 0   No current facility-administered medications on file prior to visit.     BP 118/62 (BP Location: Right Arm, Patient Position: Sitting, Cuff Size: Normal)   Pulse 62   Temp 97.9 F (36.6 C) (Oral)   Ht 5\' 6"  (1.676 m)   Wt 140 lb 9.6 oz (63.8 kg)   SpO2 99%   BMI 22.69 kg/m     Review of Systems  HENT: Positive for congestion and hearing loss.        Objective:   Physical Exam  Constitutional: She appears well-nourished. No distress.  HENT:  Both canals occluded with soft cerumen          Assessment & Plan:   Bilateral cerumen impactions.  Canals irrigated until clear History of motion sickness.  Will treat with Transderm scopolamine  Rogelia BogaKWIATKOWSKI,Primus Gritton FRANK

## 2016-08-01 NOTE — Progress Notes (Signed)
Pre visit review using our clinic review tool, if applicable. No additional management support is needed unless otherwise documented below in the visit note. 

## 2016-08-01 NOTE — Patient Instructions (Addendum)
Earwax Buildup Your ears make a substance called earwax. It may also be called cerumen. Sometimes, too much earwax builds up in your ear canal. This can cause ear pain and make it harder for you to hear. CAUSES This condition is caused by too much earwax production or buildup. RISK FACTORS The following factors may make you more likely to develop this condition:  Cleaning your ears often with swabs.  Having narrow ear canals.  Having earwax that is overly thick or sticky.  Having eczema.  Being dehydrated. SYMPTOMS Symptoms of this condition include:  Reduced hearing.  Ear drainage.  Ear pain.  Ear itch.  A feeling of fullness in the ear or feeling that the ear is plugged.  Ringing in the ear.  Coughing. DIAGNOSIS Your health care provider can diagnose this condition based on your symptoms and medical history. Your health care provider will also do an ear exam to look inside your ear with a scope (otoscope). You may also have a hearing test. TREATMENT Treatment for this condition includes:  Over-the-counter or prescription ear drops to soften the earwax.  Earwax removal by a health care provider. This may be done:  By flushing the ear with body-temperature water.  With a medical instrument that has a loop at the end (earwax curette).  With a suction device. HOME CARE INSTRUCTIONS  Take over-the-counter and prescription medicines only as told by your health care provider.  Do not put any objects, including an ear swab, into your ear. You can clean the opening of your ear canal with a washcloth.  Drink enough water to keep your urine clear or pale yellow.  If you have frequent earwax buildup or you use hearing aids, consider seeing your health care provider every 6-12 months for routine preventive ear cleanings. Keep all follow-up visits as told by your health care provider. SEEK MEDICAL CARE IF:  You have ear pain.  Your condition does not improve with  treatment.  You have hearing loss.  You have blood, pus, or other fluid coming from your ear. This information is not intended to replace advice given to you by your health care provider. Make sure you discuss any questions you have with your health care provider. Document Released: 07/07/2004 Document Revised: 09/21/2015 Document Reviewed: 01/14/2015 Elsevier Interactive Patient Education  2017 [employer]  

## 2017-03-02 ENCOUNTER — Encounter: Payer: Self-pay | Admitting: Internal Medicine

## 2017-03-21 ENCOUNTER — Ambulatory Visit (INDEPENDENT_AMBULATORY_CARE_PROVIDER_SITE_OTHER): Payer: PRIVATE HEALTH INSURANCE | Admitting: *Deleted

## 2017-03-21 DIAGNOSIS — Z23 Encounter for immunization: Secondary | ICD-10-CM | POA: Diagnosis not present

## 2019-01-15 ENCOUNTER — Other Ambulatory Visit: Payer: Self-pay

## 2019-01-15 DIAGNOSIS — Z20822 Contact with and (suspected) exposure to covid-19: Secondary | ICD-10-CM

## 2019-01-16 LAB — NOVEL CORONAVIRUS, NAA: SARS-CoV-2, NAA: NOT DETECTED

## 2019-08-24 ENCOUNTER — Ambulatory Visit: Payer: PRIVATE HEALTH INSURANCE | Attending: Internal Medicine

## 2019-08-24 DIAGNOSIS — Z23 Encounter for immunization: Secondary | ICD-10-CM

## 2019-08-24 NOTE — Progress Notes (Signed)
   Covid-19 Vaccination Clinic  Name:  Becky Howard    MRN: [id number] DOB: [birth year]  08/24/2019  Becky Howard was observed post Covid-19 immunization for 15 minutes without incident. She was provided with Vaccine Information Sheet and instruction to access the V-Safe system.   Becky Howard was instructed to call 911 with any severe reactions post vaccine: Marland Kitchen Difficulty breathing  . Swelling of face and throat  . A fast heartbeat  . A bad rash all over body  . Dizziness and weakness   Immunizations Administered    Name Date Dose VIS Date Route   Pfizer COVID-19 Vaccine 08/24/2019 12:45 PM 0.3 mL 05/24/2019 Intramuscular   Manufacturer: [employer], [employer]   Lot: [id number]   NDC: [address]-2

## 2019-09-17 ENCOUNTER — Ambulatory Visit: Payer: PRIVATE HEALTH INSURANCE | Attending: Internal Medicine

## 2019-09-17 DIAGNOSIS — Z23 Encounter for immunization: Secondary | ICD-10-CM

## 2019-09-17 NOTE — Progress Notes (Signed)
   Covid-19 Vaccination Clinic  Name:  Becky Howard    MRN: [id number] DOB: 05/27/1981  09/17/2019  [name] was observed post Covid-19 immunization for 15 minutes without incident. She was provided with Vaccine Information Sheet and instruction to access the V-Safe system.   [name] was instructed to call 911 with any severe reactions post vaccine: Marland Kitchen Difficulty breathing  . Swelling of face and throat  . A fast heartbeat  . A bad rash all over body  . Dizziness and weakness   Immunizations Administered    Name Date Dose VIS Date Route   Pfizer COVID-19 Vaccine 09/17/2019 12:27 PM 0.3 mL 05/24/2019 Intramuscular   Manufacturer: [employer], [employer]   Lot: [id number]   NDC: [address]-4
